# Patient Record
Sex: Male | Born: 1945 | Race: White | Hispanic: No | Marital: Married | Smoking: Current every day smoker
Health system: Southern US, Community
[De-identification: ages and names within clinical notes are randomized; demographics above are authoritative.]

## PROBLEM LIST (undated history)

## (undated) DIAGNOSIS — I2510 Atherosclerotic heart disease of native coronary artery without angina pectoris: Secondary | ICD-10-CM

## (undated) DIAGNOSIS — I10 Essential (primary) hypertension: Secondary | ICD-10-CM

## (undated) DIAGNOSIS — D369 Benign neoplasm, unspecified site: Secondary | ICD-10-CM

## (undated) DIAGNOSIS — Z22358 Carrier of other enterobacterales: Secondary | ICD-10-CM

## (undated) DIAGNOSIS — IMO0001 Reserved for inherently not codable concepts without codable children: Secondary | ICD-10-CM

## (undated) DIAGNOSIS — J449 Chronic obstructive pulmonary disease, unspecified: Secondary | ICD-10-CM

## (undated) DIAGNOSIS — Z860101 Personal history of adenomatous and serrated colon polyps: Secondary | ICD-10-CM

## (undated) DIAGNOSIS — N390 Urinary tract infection, site not specified: Secondary | ICD-10-CM

## (undated) DIAGNOSIS — E119 Type 2 diabetes mellitus without complications: Secondary | ICD-10-CM

## (undated) DIAGNOSIS — D126 Benign neoplasm of colon, unspecified: Secondary | ICD-10-CM

## (undated) DIAGNOSIS — M7120 Synovial cyst of popliteal space [Baker], unspecified knee: Secondary | ICD-10-CM

## (undated) DIAGNOSIS — E7800 Pure hypercholesterolemia, unspecified: Secondary | ICD-10-CM

## (undated) DIAGNOSIS — N138 Other obstructive and reflux uropathy: Secondary | ICD-10-CM

## (undated) DIAGNOSIS — R7302 Impaired glucose tolerance (oral): Secondary | ICD-10-CM

## (undated) DIAGNOSIS — Z85828 Personal history of other malignant neoplasm of skin: Secondary | ICD-10-CM

## (undated) HISTORY — PX: COLONOSCOPY: SHX174

## (undated) HISTORY — PX: CARDIAC CATHETERIZATION: SHX172

## (undated) HISTORY — PX: EYE SURGERY: SHX253

## (undated) HISTORY — DX: Atherosclerotic heart disease of native coronary artery without angina pectoris: I25.10

## (undated) HISTORY — DX: Essential (primary) hypertension: I10

---

## 2004-03-19 ENCOUNTER — Ambulatory Visit: Payer: Self-pay | Admitting: Unknown Physician Specialty

## 2009-05-29 ENCOUNTER — Ambulatory Visit: Payer: Self-pay | Admitting: Unknown Physician Specialty

## 2012-01-08 ENCOUNTER — Emergency Department: Payer: Self-pay | Admitting: Emergency Medicine

## 2012-01-08 LAB — BASIC METABOLIC PANEL
BUN: 12 mg/dL (ref 7–18)
Calcium, Total: 9.2 mg/dL (ref 8.5–10.1)
Chloride: 102 mmol/L (ref 98–107)
Creatinine: 0.93 mg/dL (ref 0.60–1.30)
EGFR (African American): >60
Potassium: 3.3 mmol/L — ABNORMAL LOW (ref 3.5–5.1)

## 2012-01-08 LAB — CBC
HCT: 38.5 % — ABNORMAL LOW (ref 40.0–52.0)
HGB: 13.8 g/dL (ref 13.0–18.0)
MCH: 33.3 pg (ref 26.0–34.0)
MCHC: 35.9 g/dL (ref 32.0–36.0)
MCV: 93 fL (ref 80–100)
RDW: 13.9 % (ref 11.5–14.5)

## 2013-08-17 DIAGNOSIS — R7303 Prediabetes: Secondary | ICD-10-CM | POA: Insufficient documentation

## 2014-06-15 DIAGNOSIS — R0681 Apnea, not elsewhere classified: Secondary | ICD-10-CM | POA: Insufficient documentation

## 2014-09-27 DIAGNOSIS — Z86010 Personal history of colonic polyps: Secondary | ICD-10-CM | POA: Insufficient documentation

## 2014-12-26 ENCOUNTER — Ambulatory Visit: Admission: RE | Admit: 2014-12-26 | Payer: Self-pay | Source: Ambulatory Visit | Admitting: Unknown Physician Specialty

## 2014-12-26 ENCOUNTER — Encounter: Admission: RE | Payer: Self-pay | Source: Ambulatory Visit

## 2014-12-26 SURGERY — COLONOSCOPY WITH PROPOFOL
Anesthesia: General

## 2014-12-30 ENCOUNTER — Encounter: Payer: Self-pay | Admitting: *Deleted

## 2015-01-01 ENCOUNTER — Encounter: Payer: Self-pay | Admitting: *Deleted

## 2015-01-01 DIAGNOSIS — Z86010 Personal history of colonic polyps: Secondary | ICD-10-CM | POA: Diagnosis present

## 2015-01-01 DIAGNOSIS — Z79899 Other long term (current) drug therapy: Secondary | ICD-10-CM | POA: Diagnosis not present

## 2015-01-01 DIAGNOSIS — Z800 Family history of malignant neoplasm of digestive organs: Secondary | ICD-10-CM | POA: Diagnosis not present

## 2015-01-01 DIAGNOSIS — K5730 Diverticulosis of large intestine without perforation or abscess without bleeding: Secondary | ICD-10-CM | POA: Diagnosis not present

## 2015-01-01 DIAGNOSIS — R109 Unspecified abdominal pain: Secondary | ICD-10-CM | POA: Diagnosis not present

## 2015-01-01 DIAGNOSIS — N400 Enlarged prostate without lower urinary tract symptoms: Secondary | ICD-10-CM | POA: Diagnosis not present

## 2015-01-01 DIAGNOSIS — Z7982 Long term (current) use of aspirin: Secondary | ICD-10-CM | POA: Diagnosis not present

## 2015-01-01 DIAGNOSIS — E7800 Pure hypercholesterolemia, unspecified: Secondary | ICD-10-CM | POA: Diagnosis not present

## 2015-01-01 DIAGNOSIS — I2510 Atherosclerotic heart disease of native coronary artery without angina pectoris: Secondary | ICD-10-CM | POA: Diagnosis not present

## 2015-01-01 DIAGNOSIS — K640 First degree hemorrhoids: Secondary | ICD-10-CM | POA: Diagnosis not present

## 2015-01-01 DIAGNOSIS — I119 Hypertensive heart disease without heart failure: Secondary | ICD-10-CM | POA: Diagnosis not present

## 2015-01-01 DIAGNOSIS — Z09 Encounter for follow-up examination after completed treatment for conditions other than malignant neoplasm: Secondary | ICD-10-CM | POA: Diagnosis not present

## 2015-01-01 NOTE — ED Notes (Addendum)
Pt states he has been unable to urinate since about 5 or 6 pm today. Pt reports he is currently undergoing a colonoscopy bowel prep

## 2015-01-02 ENCOUNTER — Ambulatory Visit
Admission: RE | Admit: 2015-01-02 | Discharge: 2015-01-02 | Disposition: A | Discharge status: Home / Self Care | Payer: Medicare Other | Source: Ambulatory Visit | Attending: Unknown Physician Specialty | Admitting: Unknown Physician Specialty

## 2015-01-02 ENCOUNTER — Ambulatory Visit: Payer: Medicare Other | Admitting: *Deleted

## 2015-01-02 ENCOUNTER — Emergency Department: Payer: Medicare Other

## 2015-01-02 ENCOUNTER — Encounter
Admission: RE | Disposition: A | Discharge status: Home / Self Care | Payer: Self-pay | Source: Ambulatory Visit | Attending: Unknown Physician Specialty

## 2015-01-02 ENCOUNTER — Encounter: Payer: Self-pay | Admitting: *Deleted

## 2015-01-02 ENCOUNTER — Emergency Department
Admission: EM | Admit: 2015-01-02 | Discharge: 2015-01-02 | Disposition: A | Discharge status: Home / Self Care | Payer: Medicare Other | Attending: Unknown Physician Specialty | Admitting: Unknown Physician Specialty

## 2015-01-02 DIAGNOSIS — N401 Enlarged prostate with lower urinary tract symptoms: Secondary | ICD-10-CM

## 2015-01-02 DIAGNOSIS — Z800 Family history of malignant neoplasm of digestive organs: Secondary | ICD-10-CM | POA: Insufficient documentation

## 2015-01-02 DIAGNOSIS — Z86010 Personal history of colonic polyps: Secondary | ICD-10-CM | POA: Insufficient documentation

## 2015-01-02 DIAGNOSIS — Z7982 Long term (current) use of aspirin: Secondary | ICD-10-CM | POA: Insufficient documentation

## 2015-01-02 DIAGNOSIS — R338 Other retention of urine: Secondary | ICD-10-CM

## 2015-01-02 DIAGNOSIS — K5730 Diverticulosis of large intestine without perforation or abscess without bleeding: Secondary | ICD-10-CM | POA: Insufficient documentation

## 2015-01-02 DIAGNOSIS — Z09 Encounter for follow-up examination after completed treatment for conditions other than malignant neoplasm: Secondary | ICD-10-CM | POA: Insufficient documentation

## 2015-01-02 DIAGNOSIS — I2510 Atherosclerotic heart disease of native coronary artery without angina pectoris: Secondary | ICD-10-CM | POA: Insufficient documentation

## 2015-01-02 DIAGNOSIS — I119 Hypertensive heart disease without heart failure: Secondary | ICD-10-CM | POA: Insufficient documentation

## 2015-01-02 DIAGNOSIS — Z79899 Other long term (current) drug therapy: Secondary | ICD-10-CM | POA: Insufficient documentation

## 2015-01-02 DIAGNOSIS — N400 Enlarged prostate without lower urinary tract symptoms: Secondary | ICD-10-CM | POA: Insufficient documentation

## 2015-01-02 DIAGNOSIS — K640 First degree hemorrhoids: Secondary | ICD-10-CM | POA: Insufficient documentation

## 2015-01-02 DIAGNOSIS — R109 Unspecified abdominal pain: Secondary | ICD-10-CM | POA: Insufficient documentation

## 2015-01-02 DIAGNOSIS — E7800 Pure hypercholesterolemia, unspecified: Secondary | ICD-10-CM | POA: Insufficient documentation

## 2015-01-02 HISTORY — DX: Atherosclerotic heart disease of native coronary artery without angina pectoris: I25.10

## 2015-01-02 HISTORY — DX: Essential (primary) hypertension: I10

## 2015-01-02 HISTORY — DX: Benign neoplasm, unspecified site: D36.9

## 2015-01-02 HISTORY — DX: Impaired glucose tolerance (oral): R73.02

## 2015-01-02 HISTORY — DX: Pure hypercholesterolemia, unspecified: E78.00

## 2015-01-02 HISTORY — DX: Reserved for inherently not codable concepts without codable children: IMO0001

## 2015-01-02 HISTORY — PX: COLONOSCOPY WITH PROPOFOL: SHX5780

## 2015-01-02 LAB — URINALYSIS COMPLETE WITH MICROSCOPIC (ARMC ONLY)
Bilirubin Urine: NEGATIVE
GLUCOSE, UA: NEGATIVE mg/dL
Ketones, ur: NEGATIVE mg/dL
Leukocytes, UA: NEGATIVE
Nitrite: NEGATIVE
PROTEIN: NEGATIVE mg/dL
SPECIFIC GRAVITY, URINE: 1.004 — AB (ref 1.005–1.030)
SQUAMOUS EPITHELIAL / LPF: NONE SEEN
pH: 5 (ref 5.0–8.0)

## 2015-01-02 LAB — BASIC METABOLIC PANEL
Anion gap: 10 (ref 5–15)
BUN: 13 mg/dL (ref 6–20)
CALCIUM: 9.4 mg/dL (ref 8.9–10.3)
CHLORIDE: 104 mmol/L (ref 101–111)
CO2: 22 mmol/L (ref 22–32)
CREATININE: 0.76 mg/dL (ref 0.61–1.24)
GFR calc non Af Amer: >60 mL/min (ref >60)
GLUCOSE: 144 mg/dL — AB (ref 65–99)
Potassium: 4.1 mmol/L (ref 3.5–5.1)
Sodium: 136 mmol/L (ref 135–145)

## 2015-01-02 LAB — CBC WITH DIFFERENTIAL/PLATELET
BASOS PCT: 0 %
Basophils Absolute: 0 10*3/uL (ref 0–0.1)
Eosinophils Absolute: 0 10*3/uL (ref 0–0.7)
Eosinophils Relative: 0 %
HEMATOCRIT: 47 % (ref 40.0–52.0)
HEMOGLOBIN: 16.3 g/dL (ref 13.0–18.0)
LYMPHS ABS: 0.6 10*3/uL — AB (ref 1.0–3.6)
Lymphocytes Relative: 3 %
MCH: 31.5 pg (ref 26.0–34.0)
MCHC: 34.8 g/dL (ref 32.0–36.0)
MCV: 90.5 fL (ref 80.0–100.0)
MONO ABS: 0.8 10*3/uL (ref 0.2–1.0)
MONOS PCT: 5 %
NEUTROS ABS: 16.6 10*3/uL — AB (ref 1.4–6.5)
Neutrophils Relative %: 92 %
Platelets: 153 10*3/uL (ref 150–440)
RBC: 5.19 MIL/uL (ref 4.40–5.90)
RDW: 13 % (ref 11.5–14.5)
WBC: 18.1 10*3/uL — ABNORMAL HIGH (ref 3.8–10.6)

## 2015-01-02 SURGERY — COLONOSCOPY WITH PROPOFOL
Anesthesia: General

## 2015-01-02 MED ORDER — SODIUM CHLORIDE 0.9 % IV BOLUS (SEPSIS)
500.0000 mL | Freq: Once | INTRAVENOUS | Status: AC
  Administered 2015-01-02: 500 mL via INTRAVENOUS

## 2015-01-02 MED ORDER — PROPOFOL 500 MG/50ML IV EMUL
INTRAVENOUS | Status: DC | PRN
  Administered 2015-01-02: 120 ug/kg/min via INTRAVENOUS

## 2015-01-02 MED ORDER — TAMSULOSIN HCL 0.4 MG PO CAPS: 0.4000 mg | ORAL_CAPSULE | Freq: Every day | ORAL | Status: DC

## 2015-01-02 MED ORDER — SODIUM CHLORIDE 0.9 % IV SOLN
INTRAVENOUS | Status: DC
Start: 2015-01-02 — End: 2015-01-02
  Administered 2015-01-02: 15:00:00 via INTRAVENOUS

## 2015-01-02 MED ORDER — CIPROFLOXACIN HCL 500 MG PO TABS
500.0000 mg | ORAL_TABLET | Freq: Once | ORAL | Status: AC
  Administered 2015-01-02: 500 mg via ORAL

## 2015-01-02 MED ORDER — CIPROFLOXACIN HCL 500 MG PO TABS
ORAL_TABLET | ORAL | Status: AC
  Filled 2015-01-02: qty 1

## 2015-01-02 MED ORDER — CIPROFLOXACIN HCL 500 MG PO TABS: 500.0000 mg | ORAL_TABLET | Freq: Two times a day (BID) | ORAL | Status: DC

## 2015-01-02 MED ORDER — SODIUM CHLORIDE 0.9 % IV SOLN: INTRAVENOUS | Status: DC

## 2015-01-02 NOTE — Op Note (Signed)
[facility] Gastroenterology Patient Name: Brandon Brandt Procedure Date: 01/02/2015 3:33 PM MRN: [id number] Account #: [id number] Date of Birth: February 10, 1946 Admit Type: Outpatient Age: 69 Room: [facility] ENDO ROOM 1 Gender: Male Note Status: Finalized Procedure:         Colonoscopy Indications:       High risk colon cancer surveillance: Personal history of                     colonic polyps, Family history of colon cancer in a                     first-degree relative Providers:         Manya Silvas, MD Referring MD:      No Local Md, MD (Referring MD) Medicines:         Propofol per Anesthesia Complications:     No immediate complications. Procedure:         Pre-Anesthesia Assessment:                    - After reviewing the risks and benefits, the patient was                     deemed in satisfactory condition to undergo the procedure.                    After obtaining informed consent, the colonoscope was                     passed under direct vision. Throughout the procedure, the                     patient's blood pressure, pulse, and oxygen saturations                     were monitored continuously. The Colonoscope was                     introduced through the anus and advanced to the the cecum,                     identified by appendiceal orifice and ileocecal valve. The                     colonoscopy was performed without difficulty. The patient                     tolerated the procedure well. The quality of the bowel                     preparation was adequate to identify polyps. Findings:      Internal hemorrhoids were found during endoscopy. The hemorrhoids were       small, medium-sized and Grade I (internal hemorrhoids that do not       prolapse). Prostate was enlarged and boggy.      A single medium-mouthed diverticulum was found in the sigmoid colon. Impression:        - Internal hemorrhoids.                    - No specimens  collected. Recommendation:    - Repeat colonoscopy in 5 years due to previous polyp and  family history of colon polyp. Manya Silvas, MD 01/02/2015 3:57:45 PM This report has been signed electronically. Number of Addenda: 0 Note Initiated On: 01/02/2015 3:33 PM Scope Withdrawal Time: 0 hours 3 minutes 37 seconds  Total Procedure Duration: 0 hours 11 minutes 40 seconds       [facility]

## 2015-01-02 NOTE — Transfer of Care (Signed)
Immediate Anesthesia Transfer of Care Note  Patient: Brandon Brandt  Procedure(s) Performed: Procedure(s): COLONOSCOPY WITH PROPOFOL (N/A)  Patient Location: PACU and Endoscopy Unit  Anesthesia Type:General  Level of Consciousness: awake, alert  and oriented  Airway & Oxygen Therapy: Patient Spontanous Breathing  Post-op Assessment: Report given to RN and Post -op Vital signs reviewed and stable  Post vital signs: Reviewed and stable  Last Vitals:  Filed Vitals:   01/02/15 1558  BP:   Pulse: 77  Temp: 36.3 C  Resp: 18    Complications: No apparent anesthesia complications

## 2015-01-02 NOTE — ED Notes (Signed)
Pt states pain improved, foley continues to drain clear yellow urine without sediment at this time.

## 2015-01-02 NOTE — Anesthesia Preprocedure Evaluation (Signed)
Anesthesia Evaluation  Patient identified by MRN, date of birth, ID band Patient awake    Reviewed: Allergy & Precautions, NPO status , Patient's Chart, lab work & pertinent test results  History of Anesthesia Complications Negative for: history of anesthetic complications  Airway Mallampati: III       Dental   Pulmonary shortness of breath, COPD,  COPD inhaler,           Cardiovascular hypertension, Pt. on medications + CAD  negative cardio ROS       Neuro/Psych negative neurological ROS     GI/Hepatic negative GI ROS, Neg liver ROS,   Endo/Other  negative endocrine ROS  Renal/GU negative Renal ROS     Musculoskeletal   Abdominal   Peds  Hematology negative hematology ROS (+)   Anesthesia Other Findings   Reproductive/Obstetrics                             Anesthesia Physical Anesthesia Plan  ASA: III  Anesthesia Plan: General   Post-op Pain Management:    Induction: Intravenous  Airway Management Planned: Nasal Cannula  Additional Equipment:   Intra-op Plan:   Post-operative Plan:   Informed Consent: I have reviewed the patients History and Physical, chart, labs and discussed the procedure including the risks, benefits and alternatives for the proposed anesthesia with the patient or authorized representative who has indicated his/her understanding and acceptance.     Plan Discussed with:   Anesthesia Plan Comments:         Anesthesia Quick Evaluation

## 2015-01-02 NOTE — ED Provider Notes (Signed)
[facility] Emergency Department Provider Note  ____________________________________________  Time seen: Approximately 12:25 AM  I have reviewed the triage vital signs and the nursing notes.   HISTORY  Chief Complaint Urinary Retention    HPI Brandon Brandt is a 69 y.o. male who presents to the ED from home with a chief complaint of urinary retention. Patient has a past medical history significant for BPH. He did a colonoscopy prep yesterday; states he drank 2 gallons of fluids. Has been unable to urinate since approximately 5 or 6 PM. Denies fever, chills, chest pain, shortness of breath, nausea, vomiting. Has had multiple bowel movements since starting colonoscopy prep. Denies flank pain or hematuria. Denies prior history of kidney stones. Nothing makes his pain better or worse.   Past Medical History  Diagnosis Date  . Hypertension   . Coronary artery disease   . Pure hypercholesterolemia   . Impaired glucose tolerance   . Shortness of breath dyspnea   . Adenomatous polyps     There are no active problems to display for this patient.   Past Surgical History  Procedure Laterality Date  . Colonoscopy    . Cardiac catheterization      Current Outpatient Rx  Name  Route  Sig  Dispense  Refill  . albuterol (PROVENTIL HFA;VENTOLIN HFA) 108 (90 BASE) MCG/ACT inhaler   Inhalation   Inhale into the lungs every 6 (six) hours as needed for wheezing or shortness of breath.         Marland Kitchen ascorbic acid (VITAMIN C) 500 MG tablet   Oral   Take 500 mg by mouth daily.         Marland Kitchen aspirin 81 MG tablet   Oral   Take 81 mg by mouth daily.         Marland Kitchen lisinopril-hydrochlorothiazide (PRINZIDE,ZESTORETIC) 20-12.5 MG tablet   Oral   Take 1 tablet by mouth daily.         . Multiple Vitamin (MULTIVITAMIN) tablet   Oral   Take 1 tablet by mouth daily.         . simvastatin (ZOCOR) 80 MG tablet   Oral   Take 80 mg by mouth daily.         . SM  OMEGA-3-6-9 FATTY ACIDS PO   Oral   Take by mouth.         . varenicline (CHANTIX) 0.5 MG tablet   Oral   Take 0.5 mg by mouth 2 (two) times daily.         . vitamin E (VITAMIN E) 400 UNIT capsule   Oral   Take 400 Units by mouth daily.           Allergies Review of patient's allergies indicates no known allergies.  No family history on file.  Social History Social History  Substance Use Topics  . Smoking status: Never Smoker   . Smokeless tobacco: None  . Alcohol Use: No    Review of Systems Constitutional: No fever/chills Eyes: No visual changes. ENT: No sore throat. Cardiovascular: Denies chest pain. Respiratory: Denies shortness of breath. Gastrointestinal: Positive for abdominal pain.  No nausea, no vomiting.  No diarrhea.  No constipation. Genitourinary: Positive for urinary retention.Negative for dysuria. Musculoskeletal: Negative for back pain. Skin: Negative for rash. Neurological: Negative for headaches, focal weakness or numbness.  10-point ROS otherwise negative.  ____________________________________________   PHYSICAL EXAM:  VITAL SIGNS: ED Triage Vitals  Enc Vitals Group     BP --  Pulse Rate 01/01/15 2348 78     Resp --      Temp 01/01/15 2348 97.8 F (36.6 C)     Temp Source 01/01/15 2348 Oral     SpO2 --      Weight 01/01/15 2348 240 lb (108.863 kg)     Height 01/01/15 2348 5\' 9"  (1.753 m)     Head Cir --      Peak Flow --      Pain Score 01/01/15 2347 7     Pain Loc --      Pain Edu? --      Excl. in [place]? --    Patient was examined after Foley placement: Constitutional: Alert and oriented. Well appearing and in no acute distress. Eyes: Conjunctivae are normal. PERRL. EOMI. Head: Atraumatic. Nose: No congestion/rhinnorhea. Mouth/Throat: Mucous membranes are moist.  Oropharynx non-erythematous. Neck: No stridor.   Cardiovascular: Normal rate, regular rhythm. Grossly normal heart sounds.  Good peripheral  circulation. Respiratory: Normal respiratory effort.  No retractions. Lungs CTAB. Gastrointestinal: Soft and nontender. No distention. No abdominal bruits. No CVA tenderness. Genitourinary: Circumcised male. No testicular swelling. No inguinal masses. Strong bilateral cremaster reflexes. Musculoskeletal: No lower extremity tenderness nor edema.  No joint effusions. Neurologic:  Normal speech and language. No gross focal neurologic deficits are appreciated. No gait instability. Skin:  Skin is warm, dry and intact. No rash noted. Psychiatric: Mood and affect are normal. Speech and behavior are normal.  ____________________________________________   LABS (all labs ordered are listed, but only abnormal results are displayed)  Labs Reviewed  CBC WITH DIFFERENTIAL/PLATELET - Abnormal; Notable for the following:    WBC 18.1 (*)    Neutro Abs 16.6 (*)    Lymphs Abs 0.6 (*)    All other components within normal limits  BASIC METABOLIC PANEL - Abnormal; Notable for the following:    Glucose, Bld 144 (*)    All other components within normal limits  URINALYSIS COMPLETEWITH MICROSCOPIC ([facility] ONLY) - Abnormal; Notable for the following:    Color, Urine YELLOW (*)    APPearance CLEAR (*)    Specific Gravity, Urine 1.004 (*)    Hgb urine dipstick 1+ (*)    Bacteria, UA RARE (*)    All other components within normal limits   ____________________________________________  EKG  None ____________________________________________  RADIOLOGY  CT renal stone study interpreted per Dr. Radene Knee: 1. Significantly enlarged prostate, measuring 6.4 cm in transverse dimension, and 7.6 cm in craniocaudal dimension. This may help explain the patient's difficulty urinating. Scattered calcification noted. 2. Foley catheter in place, with decompressed bladder. 3. Scattered calcification along the abdominal aorta and its branches. 4. Scattered diverticulosis along the proximal sigmoid colon, without  evidence of diverticulitis.  ____________________________________________   PROCEDURES  Procedure(s) performed: None  Critical Care performed: No  ____________________________________________   INITIAL IMPRESSION / ASSESSMENT AND PLAN / ED COURSE  Pertinent labs & imaging results that were available during my care of the patient were reviewed by me and considered in my medical decision making (see chart for details).  69 year old male who presents with urinary retention. 879 mL seen on bladder scan. Foley placed by nurse; there are multiple small pieces of sediment in the Foley bag suspicious for kidney stones. Will obtain a renal protocol CT to evaluate. Will check screening labs including renal function. At this time patient is quite comfortable, smiling in no distress.  ----------------------------------------- 2:37 AM on 01/02/2015 -----------------------------------------  Patient resting in no acute distress. Updated  patient and spouse of laboratory and imaging results. Will initiate treatment with Flomax for BPH; patient to start this after his colonoscopy today. Will keep Foley catheter in place; start Cipro empirically, and patient will follow-up with urology. Strict return precautions given. Patient and spouse verbalize understanding and agree with plan of care. ____________________________________________   FINAL CLINICAL IMPRESSION(S) / ED DIAGNOSES  Final diagnoses:  Urinary retention due to benign prostatic hyperplasia      Paulette Blanch, MD 01/02/15 [phone]

## 2015-01-02 NOTE — H&P (Signed)
Primary Care Physician:  [place] Primary Gastroenterologist:  Dr. Vira Agar  Pre-Procedure History & Physical: HPI:  Brandon Brandt is a 69 y.o. male is here for an colonoscopy.   Past Medical History  Diagnosis Date  . Hypertension   . Coronary artery disease   . Pure hypercholesterolemia   . Impaired glucose tolerance   . Shortness of breath dyspnea   . Adenomatous polyps     Past Surgical History  Procedure Laterality Date  . Colonoscopy    . Cardiac catheterization      Prior to Admission medications   Medication Sig Start Date End Date Taking? Authorizing Provider  albuterol (PROVENTIL HFA;VENTOLIN HFA) 108 (90 BASE) MCG/ACT inhaler Inhale into the lungs every 6 (six) hours as needed for wheezing or shortness of breath.   Yes Historical Provider, MD  ascorbic acid (VITAMIN C) 500 MG tablet Take 500 mg by mouth daily.   Yes Historical Provider, MD  aspirin 81 MG tablet Take 81 mg by mouth daily.   Yes Historical Provider, MD  ciprofloxacin (CIPRO) 500 MG tablet Take 1 tablet (500 mg total) by mouth 2 (two) times daily. 01/02/15  Yes Paulette Blanch, MD  lisinopril-hydrochlorothiazide (PRINZIDE,ZESTORETIC) 20-12.5 MG tablet Take 1 tablet by mouth daily.   Yes Historical Provider, MD  Multiple Vitamin (MULTIVITAMIN) tablet Take 1 tablet by mouth daily.   Yes Historical Provider, MD  vitamin E (VITAMIN E) 400 UNIT capsule Take 400 Units by mouth daily.   Yes Historical Provider, MD  simvastatin (ZOCOR) 80 MG tablet Take 80 mg by mouth daily.    Historical Provider, MD  SM OMEGA-3-6-9 FATTY ACIDS PO Take by mouth.    Historical Provider, MD  tamsulosin (FLOMAX) 0.4 MG CAPS capsule Take 1 capsule (0.4 mg total) by mouth daily. Patient not taking: Reported on 01/02/2015 01/02/15   Paulette Blanch, MD  varenicline (CHANTIX) 0.5 MG tablet Take 0.5 mg by mouth 2 (two) times daily.    Historical Provider, MD    Allergies as of 12/08/2014  . (Not on File)    History  reviewed. No pertinent family history.  Social History   Social History  . Marital Status: Married    Spouse Name: N/A  . Number of Children: N/A  . Years of Education: N/A   Occupational History  . Not on file.   Social History Main Topics  . Smoking status: Never Smoker   . Smokeless tobacco: Not on file  . Alcohol Use: No  . Drug Use: No  . Sexual Activity: Not on file   Other Topics Concern  . Not on file   Social History Narrative    Review of Systems: See HPI, otherwise negative ROS  Physical Exam: BP 149/69 mmHg  Pulse 73  Temp(Src) 98.4 F (36.9 C) (Oral)  Resp 20  Ht 5\' 11"  (1.803 m)  Wt 108.863 kg (240 lb)  BMI 33.49 kg/m2  SpO2 98% General:   Alert,  pleasant and cooperative in NAD Head:  Normocephalic and atraumatic. Neck:  Supple; no masses or thyromegaly. Lungs:  Clear throughout to auscultation.    Heart:  Regular rate and rhythm. Abdomen:  Soft, nontender and nondistended. Normal bowel sounds, without guarding, and without rebound.   Neurologic:  Alert and  oriented x4;  grossly normal neurologically.  Impression/Plan: Brandon Brandt is here for an colonoscopy to be performed for [facility] colon polyps and [facility] in father  Risks, benefits, limitations, and alternatives regarding  colonoscopy have been reviewed with the patient.  Questions have been answered.  All parties agreeable.   Gaylyn Cheers, MD  01/02/2015, 3:34 PM

## 2015-01-02 NOTE — Discharge Instructions (Signed)
1. Take antibiotic as prescribed (Cipro 500 mg twice daily 7 days). 2. Start Flomax 0.4 mg daily; start on Tuesday after your colonoscopy. 3. Keep Foley catheter in place until seen by the urologist in 7-10 days. 4. Return to the ER for worsening symptoms, persistent vomiting, fever or other concerns.  Acute Urinary Retention, Male Acute urinary retention is the temporary inability to urinate. This is a common problem in older men. As men age their prostates become larger and block the flow of urine from the bladder. This is usually a problem that has come on gradually.  HOME CARE INSTRUCTIONS If you are sent home with a Foley catheter and a drainage system, you will need to discuss the best course of action with your health care provider. While the catheter is in, maintain a good intake of fluids. Keep the drainage bag emptied and lower than your catheter. This is so that contaminated urine will not flow back into your bladder, which could lead to a urinary tract infection. There are two main types of drainage bags. One is a large bag that usually is used at night. It has a good capacity that will allow you to sleep through the night without having to empty it. The second type is called a leg bag. It has a smaller capacity, so it needs to be emptied more frequently. However, the main advantage is that it can be attached by a leg strap and can go underneath your clothing, allowing you the freedom to move about or leave your home. Only take over-the-counter or prescription medicines for pain, discomfort, or fever as directed by your health care provider.  SEEK MEDICAL CARE IF:  You develop a low-grade fever.  You experience spasms or leakage of urine with the spasms. SEEK IMMEDIATE MEDICAL CARE IF:   You develop chills or fever.  Your catheter stops draining urine.  Your catheter falls out.  You start to develop increased bleeding that does not respond to rest and increased fluid  intake. MAKE SURE YOU:  Understand these instructions.  Will watch your condition.  Will get help right away if you are not doing well or get worse.   This information is not intended to replace advice given to you by your health care provider. Make sure you discuss any questions you have with your health care provider.   Document Released: 06/03/2000 Document Revised: 07/12/2014 Document Reviewed: 08/06/2012 Elsevier Interactive Patient Education [employer].

## 2015-01-02 NOTE — ED Notes (Signed)
Total of 1460 mL of yellow urine drained from foley bag.

## 2015-01-02 NOTE — Anesthesia Postprocedure Evaluation (Signed)
  Anesthesia Post-op Note  Patient: Brandon Brandt  Procedure(s) Performed: Procedure(s): COLONOSCOPY WITH PROPOFOL (N/A)  Anesthesia type:General  Patient location: PACU  Post pain: Pain level controlled  Post assessment: Post-op Vital signs reviewed, Patient's Cardiovascular Status Stable, Respiratory Function Stable, Patent Airway and No signs of Nausea or vomiting  Post vital signs: Reviewed and stable  Last Vitals:  Filed Vitals:   01/02/15 1630  BP: 110/65  Pulse: 68  Temp:   Resp: 20    Level of consciousness: awake, alert  and patient cooperative  Complications: No apparent anesthesia complications

## 2015-01-02 NOTE — ED Notes (Signed)
Pt has 829ml on bladder scan

## 2015-01-02 NOTE — ED Notes (Signed)
Pt denies pain currently. Pt updated on result progress. Pt verbalizes understanding.

## 2015-01-02 NOTE — ED Notes (Signed)
Pt fitted with leg bag. Pt and spouse return demonstration regarding leg bag care, catheter care, signs and symptoms of UTI/pyelonephritis, and catheter maintanence. Clear yellow urine returned in leg bag after fitting.

## 2015-01-03 ENCOUNTER — Ambulatory Visit: Payer: Self-pay | Admitting: Obstetrics and Gynecology

## 2015-01-05 ENCOUNTER — Encounter: Payer: Self-pay | Admitting: Unknown Physician Specialty

## 2015-01-09 ENCOUNTER — Other Ambulatory Visit: Payer: Self-pay | Admitting: *Deleted

## 2015-01-09 ENCOUNTER — Ambulatory Visit (INDEPENDENT_AMBULATORY_CARE_PROVIDER_SITE_OTHER): Payer: Medicare Other | Admitting: Obstetrics and Gynecology

## 2015-01-09 ENCOUNTER — Encounter: Payer: Self-pay | Admitting: Obstetrics and Gynecology

## 2015-01-09 ENCOUNTER — Ambulatory Visit: Payer: Medicare Other

## 2015-01-09 VITALS — BP 145/80 | HR 66 | Resp 16 | Ht 69.5 in | Wt 226.4 lb

## 2015-01-09 DIAGNOSIS — R339 Retention of urine, unspecified: Secondary | ICD-10-CM | POA: Diagnosis not present

## 2015-01-09 DIAGNOSIS — N400 Enlarged prostate without lower urinary tract symptoms: Secondary | ICD-10-CM | POA: Diagnosis not present

## 2015-01-09 LAB — BLADDER SCAN AMB NON-IMAGING: SCAN RESULT: 150

## 2015-01-09 MED ORDER — TAMSULOSIN HCL 0.4 MG PO CAPS: 0.4000 mg | ORAL_CAPSULE | Freq: Every day | ORAL | Status: DC

## 2015-01-09 NOTE — Addendum Note (Signed)
Addended by: Toniann Fail C on: 01/09/2015 04:16 PM   Modules accepted: Orders

## 2015-01-09 NOTE — Progress Notes (Signed)
Bladder Scan-150 Patient can void: Performed By: Toniann Fail, LPN  Pt was instructed to go to the ER if develops pain or not able to urinate over night or come to the office during hours. Pt voiced understanding.

## 2015-01-09 NOTE — Progress Notes (Signed)
01/09/2015 11:48 AM   Brandon Brandt [birth year] [id number]  Referring provider: Katheren Brandt [address] [place], [place] [address]  Chief Complaint  Patient presents with  . Establish Care  . Benign Prostatic Hypertrophy  . Urinary Retention    HPI: Patient is 69 yo male with a history of BPH presenting today with c/o of acute urinary retention after drinking 2 gallons of water for colonoscopy bowel prep 01/02/15.  He was seen in the  ED for catheter placement and was started on Flomax and Cipro for presumed prostatitis? He states that he underwent his colonoscopy and was told that his prostate was boggy.  He presents today requesting Foley catheter removal. Patient denies any fevers or pain since catheter placement.  UA unremarkable in ED but patient did have an elevated WBC of 18.1. CT renal stone protocol noting aSignificantly enlarged prostate, measuring 6.4 cm in transverse dimension, and 7.6 cm in craniocaudal dimension.   Baseline urinary symptoms include sensation of incomplete bladder emptying, frequency, intermittency, urgency, weak stream, and nocturia up to 3 times per night.    I-PSS 15 QOL- terrible   PMH: Past Medical History  Diagnosis Date  . Hypertension   . Coronary artery disease   . Pure hypercholesterolemia   . Impaired glucose tolerance   . Shortness of breath dyspnea   . Adenomatous polyps     Surgical History: Past Surgical History  Procedure Laterality Date  . Colonoscopy    . Cardiac catheterization    . Colonoscopy with propofol N/A 01/02/2015    Procedure: COLONOSCOPY WITH PROPOFOL;  Surgeon: Brandon Silvas, Brandon Brandt;  Location: [facility] ENDOSCOPY;  Service: Endoscopy;  Laterality: N/A;    Home Medications:    Medication List       This list is accurate as of: 01/09/15 11:48 AM.  Always use your most recent med list.               albuterol 108 (90 BASE) MCG/ACT inhaler  Commonly known as:  PROVENTIL HFA;VENTOLIN HFA   Inhale into the lungs every 6 (six) hours as needed for wheezing or shortness of breath.     ascorbic acid 500 MG tablet  Commonly known as:  VITAMIN C  Take 500 mg by mouth daily.     aspirin 81 MG tablet  Take 81 mg by mouth daily.     ciprofloxacin 500 MG tablet  Commonly known as:  CIPRO  Take 1 tablet (500 mg total) by mouth 2 (two) times daily.     lisinopril-hydrochlorothiazide 20-12.5 MG tablet  Commonly known as:  PRINZIDE,ZESTORETIC  Take 1 tablet by mouth daily.     multivitamin tablet  Take 1 tablet by mouth daily.     simvastatin 80 MG tablet  Commonly known as:  ZOCOR  Take 80 mg by mouth daily.     SM OMEGA-3-6-9 FATTY ACIDS PO  Take by mouth.     tamsulosin 0.4 MG Caps capsule  Commonly known as:  FLOMAX  Take 1 capsule (0.4 mg total) by mouth daily.     vitamin E 400 UNIT capsule  Generic drug:  vitamin E  Take 400 Units by mouth daily.        Allergies: No Known Allergies  Family History: Family History  Problem Relation Age of Onset  . Prostate cancer Father     Social History:  reports that he has quit smoking. His smoking use included Cigarettes. He does not have any smokeless tobacco history  on file. He reports that he does not drink alcohol or use illicit drugs.  ROS: UROLOGY Frequent Urination?: Yes Hard to postpone urination?: No Burning/pain with urination?: No Get up at night to urinate?: Yes Leakage of urine?: No Urine stream starts and stops?: No Trouble starting stream?: Yes Do you have to strain to urinate?: No Blood in urine?: No Urinary tract infection?: No Sexually transmitted disease?: No Injury to kidneys or bladder?: No Painful intercourse?: No Weak stream?: No Erection problems?: No Penile pain?: No  Gastrointestinal Nausea?: No Vomiting?: No Indigestion/heartburn?: No Diarrhea?: No Constipation?: No  Constitutional Fever: No Night sweats?: No Weight loss?: No Fatigue?: No  Skin Skin  rash/lesions?: No Itching?: No  Eyes Blurred vision?: No Double vision?: No  Ears/Nose/Throat Sore throat?: No Sinus problems?: No  Hematologic/Lymphatic Swollen glands?: No Easy bruising?: No  Cardiovascular Leg swelling?: No Chest pain?: No  Respiratory Cough?: No Shortness of breath?: Yes  Endocrine Excessive thirst?: No  Musculoskeletal Back pain?: No Joint pain?: No  Neurological Headaches?: No Dizziness?: No  Psychologic Depression?: No Anxiety?: No  Physical Exam: BP 145/80 mmHg  Pulse 66  Resp 16  Ht 5' 9.5" (1.765 m)  Wt 226 lb 6.4 oz (102.694 kg)  BMI 32.97 kg/m2  Constitutional:  Alert and oriented, No acute distress. HEENT: [place] AT, moist mucus membranes.  Trachea midline, no masses. Cardiovascular: No clubbing, cyanosis, or edema. Respiratory: Normal respiratory effort, no increased work of breathing. GI: Abdomen is soft, nontender, nondistended, no abdominal masses GU: No CVA tenderness.  Testicles descended bilaterally without palpable masses, normal circumcised phallus with Foley catheter present in urinary meatus DRE: deferred until next visit Skin: No rashes, bruises or suspicious lesions. Lymph: No cervical or inguinal adenopathy. Neurologic: Grossly intact, no focal deficits, moving all 4 extremities. Psychiatric: Normal mood and affect.  Laboratory Data:   Urinalysis    Component Value Date/Time   COLORURINE YELLOW* 01/02/2015 0024   APPEARANCEUR CLEAR* 01/02/2015 0024   LABSPEC 1.004* 01/02/2015 0024   PHURINE 5.0 01/02/2015 0024   GLUCOSEU NEGATIVE 01/02/2015 0024   HGBUR 1+* 01/02/2015 0024   BILIRUBINUR NEGATIVE 01/02/2015 0024   KETONESUR NEGATIVE 01/02/2015 0024   PROTEINUR NEGATIVE 01/02/2015 0024   NITRITE NEGATIVE 01/02/2015 0024   LEUKOCYTESUR NEGATIVE 01/02/2015 0024    Pertinent Imaging: EXAM: CT ABDOMEN AND PELVIS WITHOUT CONTRAST  TECHNIQUE: Multidetector CT imaging of the abdomen and pelvis was  performed following the standard protocol without IV contrast.  COMPARISON: None.  FINDINGS: Minimal right basilar scarring is noted.  The liver and spleen are unremarkable in appearance. The gallbladder is within normal limits. The pancreas and adrenal glands are unremarkable.  The kidneys are unremarkable in appearance. There is no evidence of hydronephrosis. No renal or ureteral stones are seen. Minimal nonspecific perinephric stranding is noted bilaterally.  No free fluid is identified. The small bowel is unremarkable in appearance. The stomach is within normal limits. No acute vascular abnormalities are seen. Scattered calcification is noted along the abdominal aorta and its branches.  The appendix is normal in caliber, without evidence of appendicitis. Scattered diverticulosis is noted about the proximal sigmoid colon, and minimally along the remainder of the colon, without evidence of diverticulitis.  The bladder is decompressed, with a Foley catheter in place. The prostate is enlarged, measuring 6.4 cm in transverse dimension, and 7.6 cm in craniocaudal dimension, with mild scattered surrounding calcification. No inguinal lymphadenopathy is seen.  No acute osseous abnormalities are identified. Vacuum phenomenon is noted at  L5-S1, with underlying facet disease.  IMPRESSION: 1. Significantly enlarged prostate, measuring 6.4 cm in transverse dimension, and 7.6 cm in craniocaudal dimension. This may help explain the patient's difficulty urinating. Scattered calcification noted. 2. Foley catheter in place, with decompressed bladder. 3. Scattered calcification along the abdominal aorta and its branches. 4. Scattered diverticulosis along the proximal sigmoid colon, without evidence of diverticulitis.   Electronically Brandon Brandt  By: Garald Balding M.D.  On: 01/02/2015 01:28  Assessment & Plan:    1. Urinary retention- Acute urinary retention after  drinking excessive fluids for colonoscopy prep. Catheter removed today without difficulty for voiding trial. Starting on Tamsulosin in ED. Patient will return this afternoon for PVR.  2. BPH- Patient reports baseline LUTS prior to episode of retention. He will continue Flomax as prescribed and may need to begin Finasteride as well pending outcome of voiding trial.  RTC in 2 months for recheck.  There are no diagnoses linked to this encounter.  Return in about 2 months (around 03/11/2015) for PVR/DRE/I-PSS.  These notes generated with voice recognition software. I apologize for typographical errors.  Brandon Brandt, [facility] [address], [place], [place] [address] [phone]

## 2015-01-19 ENCOUNTER — Ambulatory Visit (INDEPENDENT_AMBULATORY_CARE_PROVIDER_SITE_OTHER): Payer: Medicare Other | Admitting: Obstetrics and Gynecology

## 2015-01-19 ENCOUNTER — Inpatient Hospital Stay: Payer: Medicare Other

## 2015-01-19 ENCOUNTER — Inpatient Hospital Stay
Admission: AD | Admit: 2015-01-19 | Discharge: 2015-01-20 | DRG: 728 | Disposition: A | Discharge status: Home Health | Payer: Medicare Other | Source: Ambulatory Visit | Attending: Urology | Admitting: Urology

## 2015-01-19 ENCOUNTER — Encounter: Payer: Self-pay | Admitting: Obstetrics and Gynecology

## 2015-01-19 VITALS — BP 151/81 | HR 86 | Resp 16 | Ht 69.5 in | Wt 223.4 lb

## 2015-01-19 DIAGNOSIS — N390 Urinary tract infection, site not specified: Secondary | ICD-10-CM

## 2015-01-19 DIAGNOSIS — Z79899 Other long term (current) drug therapy: Secondary | ICD-10-CM

## 2015-01-19 DIAGNOSIS — I10 Essential (primary) hypertension: Secondary | ICD-10-CM | POA: Diagnosis present

## 2015-01-19 DIAGNOSIS — N453 Epididymo-orchitis: Principal | ICD-10-CM | POA: Diagnosis present

## 2015-01-19 DIAGNOSIS — N5089 Other specified disorders of the male genital organs: Secondary | ICD-10-CM | POA: Diagnosis present

## 2015-01-19 DIAGNOSIS — Z87891 Personal history of nicotine dependence: Secondary | ICD-10-CM

## 2015-01-19 DIAGNOSIS — E7800 Pure hypercholesterolemia, unspecified: Secondary | ICD-10-CM | POA: Diagnosis present

## 2015-01-19 DIAGNOSIS — I2510 Atherosclerotic heart disease of native coronary artery without angina pectoris: Secondary | ICD-10-CM | POA: Diagnosis present

## 2015-01-19 DIAGNOSIS — R3129 Other microscopic hematuria: Secondary | ICD-10-CM | POA: Diagnosis not present

## 2015-01-19 DIAGNOSIS — N401 Enlarged prostate with lower urinary tract symptoms: Secondary | ICD-10-CM | POA: Diagnosis present

## 2015-01-19 DIAGNOSIS — Z95828 Presence of other vascular implants and grafts: Secondary | ICD-10-CM

## 2015-01-19 DIAGNOSIS — B9620 Unspecified Escherichia coli [E. coli] as the cause of diseases classified elsewhere: Secondary | ICD-10-CM | POA: Diagnosis present

## 2015-01-19 LAB — COMPREHENSIVE METABOLIC PANEL
ALBUMIN: 3.8 g/dL (ref 3.5–5.0)
ALT: 59 U/L (ref 17–63)
AST: 23 U/L (ref 15–41)
Alkaline Phosphatase: 56 U/L (ref 38–126)
Anion gap: 6 (ref 5–15)
BILIRUBIN TOTAL: 0.9 mg/dL (ref 0.3–1.2)
BUN: 15 mg/dL (ref 6–20)
CO2: 28 mmol/L (ref 22–32)
CREATININE: 0.64 mg/dL (ref 0.61–1.24)
Calcium: 9.1 mg/dL (ref 8.9–10.3)
Chloride: 102 mmol/L (ref 101–111)
GFR calc Af Amer: >60 mL/min (ref >60)
GLUCOSE: 98 mg/dL (ref 65–99)
Potassium: 3.7 mmol/L (ref 3.5–5.1)
Sodium: 136 mmol/L (ref 135–145)
TOTAL PROTEIN: 7.3 g/dL (ref 6.5–8.1)

## 2015-01-19 LAB — URINALYSIS, COMPLETE
BILIRUBIN UA: NEGATIVE
GLUCOSE, UA: NEGATIVE
KETONES UA: NEGATIVE
Nitrite, UA: POSITIVE — AB
PROTEIN UA: NEGATIVE
SPEC GRAV UA: 1.02 (ref 1.005–1.030)
UUROB: 0.2 mg/dL (ref 0.2–1.0)
pH, UA: 7 (ref 5.0–7.5)

## 2015-01-19 LAB — MICROSCOPIC EXAMINATION
Epithelial Cells (non renal): NONE SEEN /hpf (ref 0–10)
RBC, UA: NONE SEEN /hpf (ref 0–?)
WBC, UA: >30 /hpf — ABNORMAL HIGH (ref 0–?)

## 2015-01-19 LAB — PROTIME-INR
INR: 1.04
PROTHROMBIN TIME: 13.8 s (ref 11.4–15.0)

## 2015-01-19 LAB — CBC WITH DIFFERENTIAL/PLATELET
BASOS PCT: 0 %
Basophils Absolute: 0 10*3/uL (ref 0–0.1)
Eosinophils Absolute: 0.1 10*3/uL (ref 0–0.7)
Eosinophils Relative: 0 %
HEMATOCRIT: 41.8 % (ref 40.0–52.0)
HEMOGLOBIN: 13.9 g/dL (ref 13.0–18.0)
LYMPHS ABS: 1.1 10*3/uL (ref 1.0–3.6)
Lymphocytes Relative: 6 %
MCH: 30.2 pg (ref 26.0–34.0)
MCHC: 33.3 g/dL (ref 32.0–36.0)
MCV: 90.6 fL (ref 80.0–100.0)
MONO ABS: 1.4 10*3/uL — AB (ref 0.2–1.0)
MONOS PCT: 7 %
NEUTROS ABS: 16.5 10*3/uL — AB (ref 1.4–6.5)
NEUTROS PCT: 87 %
Platelets: 227 10*3/uL (ref 150–440)
RBC: 4.62 MIL/uL (ref 4.40–5.90)
RDW: 13.4 % (ref 11.5–14.5)
WBC: 19 10*3/uL — ABNORMAL HIGH (ref 3.8–10.6)

## 2015-01-19 LAB — BLADDER SCAN AMB NON-IMAGING

## 2015-01-19 MED ORDER — SODIUM CHLORIDE 0.9 % IV SOLN
1.0000 g | INTRAVENOUS | Status: DC
  Administered 2015-01-19: 1 g via INTRAVENOUS
  Filled 2015-01-19 (×2): qty 1

## 2015-01-19 MED ORDER — SODIUM CHLORIDE 0.9 % IV SOLN
INTRAVENOUS | Status: DC
  Administered 2015-01-19 – 2015-01-20 (×3): via INTRAVENOUS

## 2015-01-19 MED ORDER — HEPARIN SODIUM (PORCINE) 5000 UNIT/ML IJ SOLN
5000.0000 [IU] | Freq: Three times a day (TID) | INTRAMUSCULAR | Status: DC
  Administered 2015-01-19 – 2015-01-20 (×2): 5000 [IU] via SUBCUTANEOUS
  Filled 2015-01-19: qty 1

## 2015-01-19 MED ORDER — ONDANSETRON HCL 4 MG/2ML IJ SOLN: 4.0000 mg | INTRAMUSCULAR | Status: DC | PRN

## 2015-01-19 MED ORDER — HYDROCODONE-ACETAMINOPHEN 5-325 MG PO TABS
1.0000 | ORAL_TABLET | ORAL | Status: DC | PRN
  Administered 2015-01-19 – 2015-01-20 (×3): 1 via ORAL
  Filled 2015-01-19 (×3): qty 1

## 2015-01-19 MED ORDER — LISINOPRIL 20 MG PO TABS
20.0000 mg | ORAL_TABLET | Freq: Every day | ORAL | Status: DC
  Administered 2015-01-19 – 2015-01-20 (×2): 20 mg via ORAL
  Filled 2015-01-19 (×2): qty 1

## 2015-01-19 MED ORDER — TAMSULOSIN HCL 0.4 MG PO CAPS
0.4000 mg | ORAL_CAPSULE | Freq: Every day | ORAL | Status: DC
  Administered 2015-01-19 – 2015-01-20 (×2): 0.4 mg via ORAL
  Filled 2015-01-19 (×2): qty 1

## 2015-01-19 NOTE — Progress Notes (Signed)
ANTIBIOTIC CONSULT NOTE - INITIAL  Pharmacy Consult for Renal adjustment of Antibiotic- Ertapenem Indication: Resistant UTI  No Known Allergies  Patient Measurements: Height: 5\' 10"  (177.8 cm) Weight: 218 lb 11.2 oz (99.202 kg) IBW/kg (Calculated) : 73 Adjusted Body Weight: 83.5 kg  Vital Signs: Temp: 98.9 F (37.2 C) (11/10 1333) Temp Source: Oral (11/10 1333) BP: 136/55 mmHg (11/10 1333) Pulse Rate: 76 (11/10 1333) Intake/Output from previous day:   Intake/Output from this shift:    Labs:  Recent Labs  01/19/15 1501  WBC 19.0*  HGB 13.9  PLT 227  CREATININE 0.64   Estimated Creatinine Clearance: 107.3 mL/min (by C-G formula based on Cr of 0.64).  Microbiology: Recent Results (from the past 720 hour(s))  Microscopic Examination     Status: Abnormal   Collection Time: 01/19/15 11:30 AM  Result Value Ref Range Status   WBC, UA >30 (H) 0 -  5 /hpf Final   RBC, UA None seen 0 -  2 /hpf Final   Epithelial Cells (non renal) None seen 0 - 10 /hpf Final   Bacteria, UA Many (A) None seen/Few Final    Medical History: Past Medical History  Diagnosis Date  . Hypertension   . Coronary artery disease   . Pure hypercholesterolemia   . Impaired glucose tolerance   . Shortness of breath dyspnea   . Adenomatous polyps     Medications:  Scheduled:  . ertapenem  1 g Intravenous Q24H  . heparin  5,000 Units Subcutaneous 3 times per day   Anti-infectives    Start     Dose/Rate Route Frequency Ordered Stop   01/19/15 1545  ertapenem (INVANZ) 1 g in sodium chloride 0.9 % 50 mL IVPB     1 g 100 mL/hr over 30 Minutes Intravenous Every 24 hours 01/19/15 1543       Assessment: 69 yo Male admitted with UTI/Epididymitis/Orchitis. Urine cx from 11/7 office visit is resistant to all abx except Ertapenem/Imipenem/Gentamicin. Elenor Quinones clinic telephone note 01/16/15 for culture result*)   ID consult ordered. Gentamicin consult ordered. Spoke with Dr. Erlene Quan concerning  sensitivities and will change to Ertapenem at this time.  F/U Urine cx from today 11/10.  Plan:  Will begin Ertapenem 1 gram IV Q24h for resistant UTI.   Scr 0.64 Crcl 107 ml/min  Chinita Greenland PharmD Clinical Pharmacist 01/19/2015 3:51 PM

## 2015-01-19 NOTE — Progress Notes (Signed)
01/19/2015 11:37 AM   Brandon Brandt Oct 03, 1945 [id number]  Referring provider: Katheren Shams [address] [place], [place] [address]  Chief Complaint  Patient presents with  . Swollen testicles    Right testicle per pt  . Benign Prostatic Hypertrophy    HPI: Patient is a 68 year old male with a history of BPH,  hypertension, CAD, and hypercholesterolemia presenting today with complaints of right scrotal swelling and urinary tract infection. He recently experienced an acute episode of urinary retention after drinking large amounts of fluids for a bowel prep.  He presented to our office for follow up and voiding trial 10 days ago. Foley catheter was removed and patient was started on Flomax on 01/09/15. He states that he experienced chills approximately 4 days ago. He presented to his primary care providers office 3 days ago after noticing some spots of blood in his underwear and was diagnosed with a urinary tract infection and started on by mouth Cipro. His culture returned positive for E. Coli resistant to Cipro as well as multiple other oral antibiotics. He reports that right-sided scrotal swelling began yesterday and has become increasingly worse. He denies dysuria or gross hematuria but reports continued urinary frequency every 2 hours. He denies fevers, chills, nausea, vomiting or night sweats over the last 24-48 hours.   Previous CT renal stone protocol 01/02/15 noting a significantly enlarged prostate, measuring 6.4 cm in transverse dimension, and 7.6 cm in craniocaudal dimension. No stones or other GU pathology  Baseline urinary symptoms include sensation of incomplete bladder emptying, frequency, intermittency, urgency, weak stream, and nocturia up to 3 times per night.   I-PSS 15 QOL- terrible   PMH: Past Medical History  Diagnosis Date  . Hypertension   . Coronary artery disease   . Pure hypercholesterolemia   . Impaired glucose tolerance   . Shortness of  breath dyspnea   . Adenomatous polyps     Surgical History: Past Surgical History  Procedure Laterality Date  . Colonoscopy    . Cardiac catheterization    . Colonoscopy with propofol N/A 01/02/2015    Procedure: COLONOSCOPY WITH PROPOFOL;  Surgeon: Manya Silvas, MD;  Location: [facility] ENDOSCOPY;  Service: Endoscopy;  Laterality: N/A;    Home Medications:    Medication List       This list is accurate as of: 01/19/15 11:37 AM.  Always use your most recent med list.               albuterol 108 (90 BASE) MCG/ACT inhaler  Commonly known as:  PROVENTIL HFA;VENTOLIN HFA  Inhale into the lungs every 6 (six) hours as needed for wheezing or shortness of breath.     ascorbic acid 500 MG tablet  Commonly known as:  VITAMIN C  Take 500 mg by mouth daily.     aspirin 81 MG tablet  Take 81 mg by mouth daily.     ciprofloxacin 500 MG tablet  Commonly known as:  CIPRO  Take 1 tablet (500 mg total) by mouth 2 (two) times daily.     lisinopril-hydrochlorothiazide 20-12.5 MG tablet  Commonly known as:  PRINZIDE,ZESTORETIC  Take 1 tablet by mouth daily.     multivitamin tablet  Take 1 tablet by mouth daily.     simvastatin 80 MG tablet  Commonly known as:  ZOCOR  Take 80 mg by mouth daily.     SM OMEGA-3-6-9 FATTY ACIDS PO  Take by mouth.     tamsulosin 0.4 MG Caps capsule  Commonly known as:  FLOMAX  Take 1 capsule (0.4 mg total) by mouth daily.     vitamin E 400 UNIT capsule  Generic drug:  vitamin E  Take 400 Units by mouth daily.        Allergies: No Known Allergies  Family History: Family History  Problem Relation Age of Onset  . Prostate cancer Father     Social History:  reports that he has quit smoking. His smoking use included Cigarettes. He does not have any smokeless tobacco history on file. He reports that he does not drink alcohol or use illicit drugs.  ROS: UROLOGY Frequent Urination?: Yes Hard to postpone urination?: No Burning/pain with  urination?: No Get up at night to urinate?: Yes Leakage of urine?: No Urine stream starts and stops?: No Trouble starting stream?: No Do you have to strain to urinate?: No Blood in urine?: No Urinary tract infection?: Yes Sexually transmitted disease?: No Injury to kidneys or bladder?: No Painful intercourse?: No Weak stream?: No Erection problems?: No Penile pain?: No  Gastrointestinal Nausea?: No Vomiting?: No Indigestion/heartburn?: No Diarrhea?: No Constipation?: No  Constitutional Fever: No Night sweats?: No Weight loss?: No Fatigue?: No  Skin Skin rash/lesions?: No Itching?: No  Eyes Blurred vision?: No Double vision?: No  Ears/Nose/Throat Sore throat?: No Sinus problems?: No  Hematologic/Lymphatic Swollen glands?: No Easy bruising?: No  Cardiovascular Leg swelling?: No Chest pain?: No  Respiratory Cough?: No Shortness of breath?: No  Endocrine Excessive thirst?: No  Musculoskeletal Back pain?: No Joint pain?: No  Neurological Headaches?: Yes Dizziness?: No  Psychologic Depression?: No Anxiety?: No  Physical Exam: BP 151/81 mmHg  Pulse 86  Resp 16  Ht 5' 9.5" (1.765 m)  Wt 223 lb 6.4 oz (101.334 kg)  BMI 32.53 kg/m2  Constitutional:  Alert and oriented, No acute distress. HEENT: [place] AT, moist mucus membranes.  Trachea midline, no masses. Cardiovascular: No clubbing, cyanosis, or edema. Respiratory: Normal respiratory effort, no increased work of breathing. GU: testicles descended bilaterally, erythema of right hemiscrotum, swelling of right testicle and epididymis extending up the right spermatic cord,  right scrotum TTP, left testicle normal without palpable masses Skin: No rashes, bruises or suspicious lesions. Lymph: No cervical or inguinal adenopathy. Neurologic: Grossly intact, no focal deficits, moving all 4 extremities. Psychiatric: Normal mood and affect.  Laboratory Data:   Urinalysis Results for orders placed or  performed in visit on 01/19/15  Microscopic Examination  Result Value Ref Range   WBC, UA >30 (H) 0 -  5 /hpf   RBC, UA None seen 0 -  2 /hpf   Epithelial Cells (non renal) None seen 0 - 10 /hpf   Bacteria, UA Many (A) None seen/Few  Urinalysis, Complete  Result Value Ref Range   Specific Gravity, UA 1.020 1.005 - 1.030   pH, UA 7.0 5.0 - 7.5   Color, UA Yellow Yellow   Appearance Ur Cloudy (A) Clear   Leukocytes, UA 3+ (A) Negative   Protein, UA Negative Negative/Trace   Glucose, UA Negative Negative   Ketones, UA Negative Negative   RBC, UA Trace (A) Negative   Bilirubin, UA Negative Negative   Urobilinogen, Ur 0.2 0.2 - 1.0 mg/dL   Nitrite, UA Positive (A) Negative   Microscopic Examination See below:   BLADDER SCAN AMB NON-IMAGING  Result Value Ref Range   Scan Result 71 mL    Pertinent Imaging:   Assessment & Plan:    1. Epididymitis/Orchitis-  UA showing greater than  30 WBCs, 0 RBCs, many bacteria, nitrite positive. Recent urine culture noting pan resistant  E. coli. Patient had significant right testicular and epididymal swelling and right hemiscrotum erythema on exam. Given the highly resistant Escherichia coli noted on recent urine culture patient will be directly admitted for IV antibiotics today. - Urinalysis, Complete - BLADDER SCAN AMB NON-IMAGING  2. Urinary Tract Infection- urine culture collected on 01/16/15 positive for pan resistant Escherichia coli. Patient instructed to present to the hospital for direct admission for IV antibiotic therapy. Plan to place PICC tomorrow if possible and DC home with IV antibiotics. Will consult infectious disease while patient is admitted.  3. Urinary retention- History of acute urinary retention after drinking excessive fluids for colonoscopy prep. Successful voiding trial 01/09/15. PVR 71 mL's today.  2. BPH- Patient reports baseline LUTS prior to episode of retention.  Currently managed with Flomax daily. Patient reports  continued urinary frequency that this may be related to his current urinary tract infection.  Patient was also seen and examined by Dr. Erlene Quan and she is in agreement with assessment and plan of care.  No Follow-up on file.  These notes generated with voice recognition software. I apologize for typographical errors.  Herbert Moors, [facility] [address] [place], [place] [address] [phone]

## 2015-01-19 NOTE — H&P (Signed)
01/19/2015 11:37 AM   Brandon Brandt April 20, 1945 [id number]   Chief Complaint  Patient presents with  . Swollen testicles    Right testicle per pt  . Benign Prostatic Hypertrophy    Admission H&P Patient is a 69 year old male with a history of BPH,  hypertension, CAD, and hypercholesterolemia presenting today with complaints of right scrotal swelling and urinary tract infection. He recently experienced an acute episode of urinary retention after drinking large amounts of fluids for a colonoscopy bowel prep.  He presented to our office for follow up and voiding trial 10 days ago. Voiding trial was successful and patient was started on Flomax on 01/09/15.  He states that he begin to experience chills approximately 4 days ago. He presented to his primary care providers office Monday after noticing some spots of blood in his underwear and was diagnosed with a urinary tract infection and started on by mouth Cipro. His culture returned positive for E. Coli resistant to Cipro as well as multiple other oral antibiotics. Additional complaints include worsening right-sided scrotal swelling begining yesterday and continued urinary frequency.  He denies dysuria, gross hematuria, fevers, chills, nausea, vomiting or night sweats over the last 24-48 hours.   Previous CT renal stone protocol 01/02/15 noting a significantly enlarged prostate, measuring 6.4 cm in transverse dimension, and 7.6 cm in craniocaudal dimension. No stones or other GU pathology  Baseline urinary symptoms include sensation of incomplete bladder emptying, frequency, intermittency, urgency, weak stream, and nocturia up to 3 times per night.   I-PSS 15 QOL- terrible   PMH: Past Medical History  Diagnosis Date  . Hypertension   . Coronary artery disease   . Pure hypercholesterolemia   . Impaired glucose tolerance   . Shortness of breath dyspnea   . Adenomatous polyps     Surgical History: Past Surgical History  Procedure  Laterality Date  . Colonoscopy    . Cardiac catheterization    . Colonoscopy with propofol N/A 01/02/2015    Procedure: COLONOSCOPY WITH PROPOFOL;  Surgeon: Manya Silvas, MD;  Location: [facility] ENDOSCOPY;  Service: Endoscopy;  Laterality: N/A;    Home Medications:    Medication List       This list is accurate as of: 01/19/15 11:37 AM.  Always use your most recent med list.               albuterol 108 (90 BASE) MCG/ACT inhaler  Commonly known as:  PROVENTIL HFA;VENTOLIN HFA  Inhale into the lungs every 6 (six) hours as needed for wheezing or shortness of breath.     ascorbic acid 500 MG tablet  Commonly known as:  VITAMIN C  Take 500 mg by mouth daily.     aspirin 81 MG tablet  Take 81 mg by mouth daily.     ciprofloxacin 500 MG tablet  Commonly known as:  CIPRO  Take 1 tablet (500 mg total) by mouth 2 (two) times daily.     lisinopril-hydrochlorothiazide 20-12.5 MG tablet  Commonly known as:  PRINZIDE,ZESTORETIC  Take 1 tablet by mouth daily.     multivitamin tablet  Take 1 tablet by mouth daily.     simvastatin 80 MG tablet  Commonly known as:  ZOCOR  Take 80 mg by mouth daily.     SM OMEGA-3-6-9 FATTY ACIDS PO  Take by mouth.     tamsulosin 0.4 MG Caps capsule  Commonly known as:  FLOMAX  Take 1 capsule (0.4 mg total) by mouth daily.  vitamin E 400 UNIT capsule  Generic drug:  vitamin E  Take 400 Units by mouth daily.        Allergies: No Known Allergies  Family History: Family History  Problem Relation Age of Onset  . Prostate cancer Father     Social History:  reports that he has quit smoking. His smoking use included Cigarettes. He does not have any smokeless tobacco history on file. He reports that he does not drink alcohol or use illicit drugs.  ROS: UROLOGY Frequent Urination?: Yes Hard to postpone urination?: No Burning/pain with urination?: No Get up at night to urinate?: Yes Leakage of urine?: No Urine stream starts and  stops?: No Trouble starting stream?: No Do you have to strain to urinate?: No Blood in urine?: No Urinary tract infection?: Yes Sexually transmitted disease?: No Injury to kidneys or bladder?: No Painful intercourse?: No Weak stream?: No Erection problems?: No Penile pain?: No  Gastrointestinal Nausea?: No Vomiting?: No Indigestion/heartburn?: No Diarrhea?: No Constipation?: No  Constitutional Fever: No Night sweats?: No Weight loss?: No Fatigue?: No  Skin Skin rash/lesions?: No Itching?: No  Eyes Blurred vision?: No Double vision?: No  Ears/Nose/Throat Sore throat?: No Sinus problems?: No  Hematologic/Lymphatic Swollen glands?: No Easy bruising?: No  Cardiovascular Leg swelling?: No Chest pain?: No  Respiratory Cough?: No Shortness of breath?: No  Endocrine Excessive thirst?: No  Musculoskeletal Back pain?: No Joint pain?: No  Neurological Headaches?: Yes Dizziness?: No  Psychologic Depression?: No Anxiety?: No  Physical Exam: BP 151/81 mmHg  Pulse 86  Resp 16  Ht 5' 9.5" (1.765 m)  Wt 223 lb 6.4 oz (101.334 kg)  BMI 32.53 kg/m2  Constitutional:  Alert and oriented, No acute distress. HEENT: [place] AT, moist mucus membranes.  Trachea midline, no masses. Cardiovascular: No clubbing, cyanosis, or edema. Respiratory: Normal respiratory effort, no increased work of breathing. GU: testicles descended bilaterally, erythema of right hemiscrotum, swelling of right testicle and epididymis extending up the right spermatic cord,  right scrotum TTP, left testicle normal without palpable masses Skin: No rashes, bruises or suspicious lesions. Lymph: No cervical or inguinal adenopathy. Neurologic: Grossly intact, no focal deficits, moving all 4 extremities. Psychiatric: Normal mood and affect.  Laboratory Data:   Urinalysis Results for orders placed or performed in visit on 01/19/15  Microscopic Examination  Result Value Ref Range   WBC, UA >30  (H) 0 -  5 /hpf   RBC, UA None seen 0 -  2 /hpf   Epithelial Cells (non renal) None seen 0 - 10 /hpf   Bacteria, UA Many (A) None seen/Few  Urinalysis, Complete  Result Value Ref Range   Specific Gravity, UA 1.020 1.005 - 1.030   pH, UA 7.0 5.0 - 7.5   Color, UA Yellow Yellow   Appearance Ur Cloudy (A) Clear   Leukocytes, UA 3+ (A) Negative   Protein, UA Negative Negative/Trace   Glucose, UA Negative Negative   Ketones, UA Negative Negative   RBC, UA Trace (A) Negative   Bilirubin, UA Negative Negative   Urobilinogen, Ur 0.2 0.2 - 1.0 mg/dL   Nitrite, UA Positive (A) Negative   Microscopic Examination See below:   BLADDER SCAN AMB NON-IMAGING  Result Value Ref Range   Scan Result 71 mL    Pertinent Imaging:   Assessment & Plan: 69 year old male with a history of BPH and recent episode of acute urinary retention requiring Foley placement. Patient developed urinary tract infection and subsequent right epididymal orchitis after Foley  catheter removal. Recent urine culture positive for pan resistant Escherichia coli. He is being admitted today for menstruation of IV antibiotic therapy and PICC line placement for home administration of antibiotics after discharge.   1. Epididymitis/Orchitis-  UA showing greater than 30 WBCs, 0 RBCs, many bacteria, nitrite positive. Recent urine culture noting pan resistant  E. coli. Patient had significant right testicular and epididymal swelling and right hemiscrotum erythema on exam. Given the highly resistant Escherichia coli noted on recent urine culture patient will receive IV Gentamicin while inpatient and d/c with home infusions per ID reccommendations. - Urinalysis, Complete - BLADDER SCAN AMB NON-IMAGING  2. Urinary Tract Infection- Urine culture collected on 01/16/15 positive for pan resistant Escherichia coli. Patient instructed to present to the hospital for direct admission for IV antibiotic therapy. Plan to place PICC tomorrow if possible and  DC home with IV antibiotics. Will consult infectious disease while inpatient.  3. Urinary retention- History of acute urinary retention after drinking excessive fluids for colonoscopy prep. Successful voiding trial 01/09/15. PVR 71 mL's today.  2. BPH- Patient reports baseline LUTS prior to episode of retention.  Currently managed with Flomax daily. Patient reports continued urinary frequency that this may be related to his current urinary tract infection.  Patient was also seen and examined by Dr. Erlene Quan and she is in agreement with assessment and plan of care.  These notes generated with voice recognition software. I apologize for typographical errors.  Herbert Moors, [facility] [address], [place], [place] [address] [phone]   I have seen and examined the patient, labs/ imaging reviewed and discussed his management with Mrs. Overton.  I reviewed the resident's note and agree with the documented findings and plan of care.    Hollice Espy, MD .

## 2015-01-20 DIAGNOSIS — N453 Epididymo-orchitis: Principal | ICD-10-CM

## 2015-01-20 MED ORDER — IBUPROFEN 800 MG PO TABS: 800.0000 mg | ORAL_TABLET | Freq: Three times a day (TID) | ORAL | Status: DC | PRN

## 2015-01-20 MED ORDER — SODIUM CHLORIDE 0.9 % IV SOLN
1.0000 g | INTRAVENOUS | Status: DC
  Administered 2015-01-20: 1 g via INTRAVENOUS
  Filled 2015-01-20: qty 1

## 2015-01-20 NOTE — Discharge Instructions (Signed)
Please monitor for fevers. Seek immediate attention for fevers, uncontrolled pain or other concerning symptoms. Continue IV antibiotic therapy at home as directed by home health nurse. Please follow up next week at [facility] urological for recheck.

## 2015-01-20 NOTE — Progress Notes (Addendum)
Infectious Disease Long Term IV Antibiotic Orders  Diagnosis: UTI, orchitis, epidymitiis  Culture results ESBL E coli  Allergies: No Known Allergies  Discharge antibiotics Meropenem 1000 mg every 8 hours  PICC Care per protocol Labs weekly while on IV antibiotics      FAX weekly labs to [phone] CBC w diff   Comprehensive met panel  Planned duration of antibiotics - 3 weeks  Stop date Feb 09 2015 Follow up clinic date [facility]- No scheduled ID follow up needed   Leonel Ramsay, MD

## 2015-01-20 NOTE — Progress Notes (Addendum)
Patient A/O, no noted distress. Patient notes only in minimal pain. NPO after midnight. Right testicles redness, swollen. Patient ambulated to the bathroom without any complications. Held heparin resulted in PICC placement.  Tolerated meds well. Patient was educated on medications and PICC placement prior/after. PICC is proper placement noted by xray. Staff will continue to monitor and meet needs.

## 2015-01-20 NOTE — Discharge Summary (Signed)
Date of admission: 01/19/2015  Date of discharge: 01/20/2015  Admission diagnosis: Epididymitis and Orchitis  Discharge diagnosis: Epididymitis and Orchitis  Secondary diagnoses:  Patient Active Problem List   Diagnosis Date Noted  . Orchitis and epididymitis 01/19/2015    History and Physical: For full details, please see admission history and physical. Briefly, Brandon Brandt is a 69 y.o. year old patient with epididymo-orchitis and ESBL  UTI admitted for initiation of IV antibiotics.  PICC line was placed and patient will complete therapy as outpatient. He remained afebrile. Exam stable. ID was consulted.  Hospital Course: Patient tolerated the procedure well.  He was then transferred to the floor after an uneventful PACU stay.  His hospital course was uncomplicated.    Laboratory values:   Recent Labs  01/19/15 1501  WBC 19.0*  HGB 13.9  HCT 41.8    Recent Labs  01/19/15 1501  NA 136  K 3.7  CL 102  CO2 28  GLUCOSE 98  BUN 15  CREATININE 0.64  CALCIUM 9.1    Recent Labs  01/19/15 1501  INR 1.04   No results for input(s): LABURIN in the last 72 hours. Results for orders placed or performed in visit on 01/19/15  Microscopic Examination     Status: Abnormal   Collection Time: 01/19/15 11:30 AM  Result Value Ref Range Status   WBC, UA >30 (H) 0 -  5 /hpf Final   RBC, UA None seen 0 -  2 /hpf Final   Epithelial Cells (non renal) None seen 0 - 10 /hpf Final   Bacteria, UA Many (A) None seen/Few Final    Disposition: Home  Discharge instruction: Patient instructed to take Motrin prn for pain.  Monitor for fevers and seek immediate medical attention for fever or worsening of symptoms.  Discharge medications:   Medication List    STOP taking these medications        ciprofloxacin 500 MG tablet  Commonly known as:  CIPRO      TAKE these medications        albuterol 108 (90 BASE) MCG/ACT inhaler  Commonly known as:  PROVENTIL HFA;VENTOLIN HFA  Inhale  into the lungs every 6 (six) hours as needed for wheezing or shortness of breath.     ascorbic acid 500 MG tablet  Commonly known as:  VITAMIN C  Take 500 mg by mouth daily.     aspirin 81 MG tablet  Take 81 mg by mouth daily.     ibuprofen 800 MG tablet  Commonly known as:  ADVIL,MOTRIN  Take 1 tablet (800 mg total) by mouth every 8 (eight) hours as needed for mild pain or moderate pain.     lisinopril-hydrochlorothiazide 20-12.5 MG tablet  Commonly known as:  PRINZIDE,ZESTORETIC  Take 1 tablet by mouth daily.     multivitamin tablet  Take 1 tablet by mouth daily.     simvastatin 80 MG tablet  Commonly known as:  ZOCOR  Take 80 mg by mouth daily.     SM OMEGA-3-6-9 FATTY ACIDS PO  Take by mouth.     tamsulosin 0.4 MG Caps capsule  Commonly known as:  FLOMAX  Take 1 capsule (0.4 mg total) by mouth daily.     vitamin E 400 UNIT capsule  Generic drug:  vitamin E  Take 400 Units by mouth daily.        Followup:      Follow-up Information    Follow up with [place].  Schedule an appointment as soon as possible for a visit in 4 days.   Specialty:  Urology   Contact information:   [address] [place] [phone]

## 2015-01-20 NOTE — Progress Notes (Addendum)
Urology Consult Follow Up  Subjective: Patient is a 69yo male admitted for treatment of ESBL E. Coli UTI and epididymo-orchitis. He has been receiving IV ertapenem and tolerating it well.  Patient reports overall feeling well today.  He does reports a slight increase in scrotal pain.  Did take Vicodin once last night. Patient afebrile.  Anti-infectives: Anti-infectives    Start     Dose/Rate Route Frequency Ordered Stop   01/20/15 1800  ertapenem (INVANZ) 1 g in sodium chloride 0.9 % 50 mL IVPB     1 g 100 mL/hr over 30 Minutes Intravenous Every 24 hours 01/20/15 1052     01/19/15 1545  ertapenem (INVANZ) 1 g in sodium chloride 0.9 % 50 mL IVPB  Status:  Discontinued     1 g 100 mL/hr over 30 Minutes Intravenous Every 24 hours 01/19/15 1543 01/20/15 1052      Current Facility-Administered Medications  Medication Dose Route Frequency Provider Last Rate Last Dose  . 0.9 %  sodium chloride infusion   Intravenous Continuous Roda Shutters, FNP 100 mL/hr at 01/20/15 0900    . ertapenem (INVANZ) 1 g in sodium chloride 0.9 % 50 mL IVPB  1 g Intravenous Q24H Charlett Nose, RPH      . heparin injection 5,000 Units  5,000 Units Subcutaneous 3 times per day Roda Shutters, FNP   5,000 Units at 01/20/15 [phone]  . HYDROcodone-acetaminophen (NORCO/VICODIN) 5-325 MG per tablet 1-2 tablet  1-2 tablet Oral Q4H PRN Roda Shutters, FNP   1 tablet at 01/20/15 [id number]  . lisinopril (PRINIVIL,ZESTRIL) tablet 20 mg  20 mg Oral Daily Festus Aloe, MD   20 mg at 01/20/15 0857  . ondansetron (ZOFRAN) injection 4 mg  4 mg Intravenous Q4H PRN Roda Shutters, FNP      . tamsulosin [facility]) capsule 0.4 mg  0.4 mg Oral Daily Festus Aloe, MD   0.4 mg at 01/20/15 0857     Objective: Vital signs in last 24 hours: Temp:  [97.6 F (36.4 C)-98.9 F (37.2 C)] 97.6 F (36.4 C) (11/11 0536) Pulse Rate:  [76-83] 77 (11/11 0536) Resp:  [14-16] 16 (11/11 0536) BP: (118-136)/(50-55) 118/50 mmHg (11/11  0536) SpO2:  [97 %] 97 % (11/11 0536) Weight:  [218 lb 11.2 oz (99.202 kg)] 218 lb 11.2 oz (99.202 kg) (11/10 1348)  Intake/Output from previous day: 11/10 0701 - 11/11 0700 In: [id number] [P.O.:240; I.V.:1355] Out: 600 [Urine:600] Intake/Output this shift: Total I/O In: 588.3 [I.V.:588.3] Out: 500 [Urine:500]   Physical Exam  Lab Results:   Recent Labs  01/19/15 1501  WBC 19.0*  HGB 13.9  HCT 41.8  PLT 227   BMET  Recent Labs  01/19/15 1501  NA 136  K 3.7  CL 102  CO2 28  GLUCOSE 98  BUN 15  CREATININE 0.64  CALCIUM 9.1   PT/INR  Recent Labs  01/19/15 1501  LABPROT 13.8  INR 1.04   ABG No results for input(s): PHART, HCO3 in the last 72 hours.  Invalid input(s): PCO2, PO2  Studies/Results: Dg Chest Port 1 View  01/19/2015  CLINICAL DATA:  PICC line placement. EXAM: PORTABLE CHEST 1 VIEW COMPARISON:  None available. FINDINGS: The cardiac silhouette, mediastinal and hilar contours are within normal limits. The lungs are clear. The left PICC line tip is in the distal SVC near the cavoatrial junction. No complicating features. Remote healed right rib fractures are noted. IMPRESSION: Left PICC line tip is in the distal SVC.  No complicating features. No acute cardiopulmonary findings. Electronically Signed   By: Marijo Sanes M.D.   On: 01/19/2015 23:30    Assessment:  1. UTI- ESBL E. Coli UTI s/p acute urinary retention requiring short term Foley catheter. Patient tolerating IV Ertapenem therapy well. Scrotal swelling and erythema stable.  2. Epididymitis/Orchitis-  Sequela of ESBL E. Coli positive UTI.  Swelling and pain stable today. No systemic symptoms.  Plan:  1. UTI and Epididymitis/Orchitis-  We will continue IV antibiotic therapy as out patient with Meropenem (due to insurance coverage) via PICC line for 21 days per ID.  Patient needs to follow up in at our office early next week for a recheck.    [facility] [address] [place], [place] [address] [phone]    LOS: 1 day    Herbert Moors 01/20/2015   Patient seen and examined today. Agree with above assessment and plan by Carlyle Lipa. Patient with PICC line started on IV antibiotics for ESBL Escherichia coli. ID consult pending. Plan to arrange for IV antibiotics as outpatient, into SPECT discharge today. All of the patient's questions were answered in detail.  Hollice Espy, MD

## 2015-01-20 NOTE — Care Management Important Message (Signed)
Important Message  Patient Details  Name: Brandon Brandt MRN: [id number] Date of Birth: November 17, 1945   Medicare Important Message Given:  Yes    Alvie Heidelberg, RN 01/20/2015, 1:13 PM

## 2015-01-20 NOTE — Consult Note (Signed)
[facility] Infectious Disease     Reason for Consult: ESBL UTI, orchitis    Referring Physician: Hollice Espy Date of Admission:  01/19/2015   Active Problems:   Orchitis and epididymitis  HPI: Brandon Brandt is a 69 y.o. male with hx of BPH who had recent foley placed for acute urinary retention. He was on abx as well from ED> CT showed prostatomegaly.  Had cath removed with voiding trial. Developed  had fevers chills and seen by PCP with +UA and UCX sent. Started cipro but cx with ESBL.  He developed R scotal swelling as well so admitted by urology Feels a little better with ertapenem.    Past Medical History  Diagnosis Date  . Hypertension   . Coronary artery disease   . Pure hypercholesterolemia   . Impaired glucose tolerance   . Shortness of breath dyspnea   . Adenomatous polyps    Past Surgical History  Procedure Laterality Date  . Colonoscopy    . Cardiac catheterization    . Colonoscopy with propofol N/A 01/02/2015    Procedure: COLONOSCOPY WITH PROPOFOL;  Surgeon: Manya Silvas, MD;  Location: [facility] ENDOSCOPY;  Service: Endoscopy;  Laterality: N/A;   Social History  Substance Use Topics  . Smoking status: Former Smoker    Types: Cigarettes  . Smokeless tobacco: None  . Alcohol Use: No   Family History  Problem Relation Age of Onset  . Prostate cancer Father     Allergies: No Known Allergies  Current antibiotics: Antibiotics Given (last 72 hours)    Date/Time Action Medication Dose Rate   01/19/15 1724 Given   ertapenem (INVANZ) 1 g in sodium chloride 0.9 % 50 mL IVPB 1 g 100 mL/hr   01/20/15 1504 Given   ertapenem (INVANZ) 1 g in sodium chloride 0.9 % 50 mL IVPB 1 g 100 mL/hr      MEDICATIONS: . ertapenem  1 g Intravenous Q24H  . heparin  5,000 Units Subcutaneous 3 times per day  . lisinopril  20 mg Oral Daily  . tamsulosin  0.4 mg Oral Daily    Review of Systems - 11 systems reviewed and negative per HPI   OBJECTIVE: Temp:  [97.6 F  (36.4 C)-98.4 F (36.9 C)] 97.6 F (36.4 C) (11/11 0536) Pulse Rate:  [77-83] 77 (11/11 0536) Resp:  [16] 16 (11/11 0536) BP: (118-134)/(50-51) 118/50 mmHg (11/11 0536) SpO2:  [97 %] 97 % (11/11 0536) Physical Exam  Constitutional: He is oriented to person, place, and time. He appears well-developed and well-nourished. No distress.  HENT:  Mouth/Throat: Oropharynx is clear and moist. No oropharyngeal exudate.  Cardiovascular: Normal rate, regular rhythm and normal heart sounds. Exam reveals no gallop and no friction rub.  No murmur heard.  Pulmonary/Chest: Effort normal and breath sounds normal. No respiratory distress. He has no wheezes.  Abdominal: Soft. Bowel sounds are normal. He exhibits no distension. There is no tenderness.  Lymphadenopathy: He has no cervical adenopathy.  GU, R testicle is red, swollen markedly enlarged, firm Neurological: He is alert and oriented to person, place, and time.  Skin: Skin is warm and dry. No rash noted. No erythema.  Psychiatric: He has a normal mood and affect. His behavior is normal.     LABS: Results for orders placed or performed during the hospital encounter of 01/19/15 (from the past 48 hour(s))  Comprehensive metabolic panel     Status: None   Collection Time: 01/19/15  3:01 PM  Result Value Ref  Range   Sodium 136 135 - 145 mmol/L   Potassium 3.7 3.5 - 5.1 mmol/L   Chloride 102 101 - 111 mmol/L   CO2 28 22 - 32 mmol/L   Glucose, Bld 98 65 - 99 mg/dL   BUN 15 6 - 20 mg/dL   Creatinine, Ser 0.64 0.61 - 1.24 mg/dL   Calcium 9.1 8.9 - 10.3 mg/dL   Total Protein 7.3 6.5 - 8.1 g/dL   Albumin 3.8 3.5 - 5.0 g/dL   AST 23 15 - 41 U/L   ALT 59 17 - 63 U/L   Alkaline Phosphatase 56 38 - 126 U/L   Total Bilirubin 0.9 0.3 - 1.2 mg/dL   GFR calc non Af Amer >60 >60 mL/min   GFR calc Af Amer >60 >60 mL/min    Comment: (NOTE) The eGFR has been calculated using the CKD EPI equation. This calculation has not been validated in all clinical  situations. eGFR's persistently <60 mL/min signify possible Chronic Kidney Disease.    Anion gap 6 5 - 15  Protime-INR     Status: None   Collection Time: 01/19/15  3:01 PM  Result Value Ref Range   Prothrombin Time 13.8 11.4 - 15.0 seconds   INR 1.04   CBC WITH DIFFERENTIAL     Status: Abnormal   Collection Time: 01/19/15  3:01 PM  Result Value Ref Range   WBC 19.0 (H) 3.8 - 10.6 K/uL   RBC 4.62 4.40 - 5.90 MIL/uL   Hemoglobin 13.9 13.0 - 18.0 g/dL   HCT 41.8 40.0 - 52.0 %   MCV 90.6 80.0 - 100.0 fL   MCH 30.2 26.0 - 34.0 pg   MCHC 33.3 32.0 - 36.0 g/dL   RDW 13.4 11.5 - 14.5 %   Platelets 227 150 - 440 K/uL   Neutrophils Relative % 87 %   Neutro Abs 16.5 (H) 1.4 - 6.5 K/uL   Lymphocytes Relative 6 %   Lymphs Abs 1.1 1.0 - 3.6 K/uL   Monocytes Relative 7 %   Monocytes Absolute 1.4 (H) 0.2 - 1.0 K/uL   Eosinophils Relative 0 %   Eosinophils Absolute 0.1 0 - 0.7 K/uL   Basophils Relative 0 %   Basophils Absolute 0.0 0 - 0.1 K/uL   No components found for: ESR, C REACTIVE PROTEIN MICRO: Recent Results (from the past 720 hour(s))  Microscopic Examination     Status: Abnormal   Collection Time: 01/19/15 11:30 AM  Result Value Ref Range Status   WBC, UA >30 (H) 0 -  5 /hpf Final   RBC, UA None seen 0 -  2 /hpf Final   Epithelial Cells (non renal) None seen 0 - 10 /hpf Final   Bacteria, UA Many (A) None seen/Few Final    IMAGING: Dg Chest Port 1 View  01/19/2015  CLINICAL DATA:  PICC line placement. EXAM: PORTABLE CHEST 1 VIEW COMPARISON:  None available. FINDINGS: The cardiac silhouette, mediastinal and hilar contours are within normal limits. The lungs are clear. The left PICC line tip is in the distal SVC near the cavoatrial junction. No complicating features. Remote healed right rib fractures are noted. IMPRESSION: Left PICC line tip is in the distal SVC.  No complicating features. No acute cardiopulmonary findings. Electronically Signed   By: Marijo Sanes M.D.   On:  01/19/2015 23:30   Ct Renal Stone Study  01/02/2015  CLINICAL DATA:  Acute onset of difficulty urinating. Lower abdominal pain. Initial encounter. EXAM: CT  ABDOMEN AND PELVIS WITHOUT CONTRAST TECHNIQUE: Multidetector CT imaging of the abdomen and pelvis was performed following the standard protocol without IV contrast. COMPARISON:  None. FINDINGS: Minimal right basilar scarring is noted. The liver and spleen are unremarkable in appearance. The gallbladder is within normal limits. The pancreas and adrenal glands are unremarkable. The kidneys are unremarkable in appearance. There is no evidence of hydronephrosis. No renal or ureteral stones are seen. Minimal nonspecific perinephric stranding is noted bilaterally. No free fluid is identified. The small bowel is unremarkable in appearance. The stomach is within normal limits. No acute vascular abnormalities are seen. Scattered calcification is noted along the abdominal aorta and its branches. The appendix is normal in caliber, without evidence of appendicitis. Scattered diverticulosis is noted about the proximal sigmoid colon, and minimally along the remainder of the colon, without evidence of diverticulitis. The bladder is decompressed, with a Foley catheter in place. The prostate is enlarged, measuring 6.4 cm in transverse dimension, and 7.6 cm in craniocaudal dimension, with mild scattered surrounding calcification. No inguinal lymphadenopathy is seen. No acute osseous abnormalities are identified. Vacuum phenomenon is noted at L5-S1, with underlying facet disease. IMPRESSION: 1. Significantly enlarged prostate, measuring 6.4 cm in transverse dimension, and 7.6 cm in craniocaudal dimension. This may help explain the patient's difficulty urinating. Scattered calcification noted. 2. Foley catheter in place, with decompressed bladder. 3. Scattered calcification along the abdominal aorta and its branches. 4. Scattered diverticulosis along the proximal sigmoid colon,  without evidence of diverticulitis. Electronically Signed   By: Garald Balding M.D.   On: 01/02/2015 01:28   11/7 UCX Urine Culture, Routine - [facility] Final report (A) Result 1 - [facility] Escherichia coli (A) Comment: Greater than 100,000 colony forming units per mL Antimicrobial Susceptibility - [facility] Comment Comment:       ** S = Susceptible; I = Intermediate; R = Resistant **                    P = Positive; N = Negative             MICS are expressed in micrograms per mL    Antibiotic                 RSLT#1    RSLT#2    RSLT#3    RSLT#4 Amoxicillin/Clavulanic Acid    R Ampicillin                     R Cefazolin                      R Cefepime                       R Ceftriaxone                    R Cefuroxime                     R Cephalothin                    R Ciprofloxacin                  R Ertapenem                      S Gentamicin  S Imipenem                       S Levofloxacin                   R Nitrofurantoin                 S Piperacillin                   R Tetracycline                   R Tobramycin                     R Trimethoprim/Sulfa             R  Assessment:   Brandon Brandt is a 69 y.o. male with ESBL UTI, orchitis following foley placement for BPH with urinary retention.   Recommendations Cont meropenem 1 gm q8 hr 14-21 days I rec the longer duration given the complicated nature of the UTI with orchitis.   Will need weekly labs.  I can see in 2 weeks. Potential to stop IV and change to macrobid if markedly improved at that time but I would likely favor the full 21 day course. Thank you very much for allowing me to participate in the care of this patient. Please call with questions.   Cheral Marker. Ola Spurr, MD

## 2015-01-20 NOTE — Care Management (Signed)
Discussed case with Floydene Flock [facility], Dr Ola Spurr and Carlyle Lipa PA. Invanz not on patient insurance formulary. Dr Ola Spurr changed home IVABX to [facility]. Informed Dr Audree Bane office of change.  [facility] would be able to start treatment tomorrow.  Patient Spouse is available to be taught how to assist with IV medications. Patient will get this afternoons dose of Invanz prior to discharge and then start [place] with home health tomorrow.

## 2015-01-20 NOTE — Progress Notes (Signed)
Pt stable. D/c instructions given and education provided. IV removed. [place] set up for IV antix; PICC line in place with drsg clean, dry, and intact. PICC flushed. Pt getting dressed and will be escorted out by staff.

## 2015-01-20 NOTE — Care Management (Signed)
Spoke with PA  Herbert Moors [phone]  from Dr Cherrie Gauze office concerning patient home health orders. Pt will need course of Invanz over the next 2 weeks. Plan is for discharge today after consult with Dr Erlene Quan and ID.   Spoke with patient and spouse concerning discharge plan. Patient will need home antibiotic therapy. PICC line is placed. Patient is due for afternoon dose of med at 1600 today.  Gave patient and spouse copy of Home Health provider list.  [place] due to affiliation with [facility]. Referral placed with Floydene Flock at [facility].

## 2015-01-24 ENCOUNTER — Encounter: Payer: Self-pay | Admitting: Urology

## 2015-01-24 ENCOUNTER — Ambulatory Visit (INDEPENDENT_AMBULATORY_CARE_PROVIDER_SITE_OTHER): Payer: Medicare Other | Admitting: Urology

## 2015-01-24 VITALS — BP 160/70 | HR 76 | Ht 71.0 in | Wt 222.6 lb

## 2015-01-24 DIAGNOSIS — N390 Urinary tract infection, site not specified: Secondary | ICD-10-CM

## 2015-01-24 DIAGNOSIS — E7800 Pure hypercholesterolemia, unspecified: Secondary | ICD-10-CM | POA: Insufficient documentation

## 2015-01-24 DIAGNOSIS — N453 Epididymo-orchitis: Secondary | ICD-10-CM

## 2015-01-24 DIAGNOSIS — R339 Retention of urine, unspecified: Secondary | ICD-10-CM

## 2015-01-24 DIAGNOSIS — N400 Enlarged prostate without lower urinary tract symptoms: Secondary | ICD-10-CM | POA: Diagnosis not present

## 2015-01-24 DIAGNOSIS — I2510 Atherosclerotic heart disease of native coronary artery without angina pectoris: Secondary | ICD-10-CM

## 2015-01-24 DIAGNOSIS — I10 Essential (primary) hypertension: Secondary | ICD-10-CM | POA: Insufficient documentation

## 2015-01-24 HISTORY — DX: Atherosclerotic heart disease of native coronary artery without angina pectoris: I25.10

## 2015-01-24 HISTORY — DX: Essential (primary) hypertension: I10

## 2015-01-24 LAB — URINALYSIS, COMPLETE
Bilirubin, UA: NEGATIVE
Glucose, UA: NEGATIVE
Nitrite, UA: NEGATIVE
Protein, UA: NEGATIVE
SPEC GRAV UA: 1.025 (ref 1.005–1.030)
Urobilinogen, Ur: 0.2 mg/dL (ref 0.2–1.0)
pH, UA: 6.5 (ref 5.0–7.5)

## 2015-01-24 LAB — MICROSCOPIC EXAMINATION
EPITHELIAL CELLS (NON RENAL): NONE SEEN /HPF (ref 0–10)
RBC, UA: NONE SEEN /hpf (ref 0–?)

## 2015-01-24 NOTE — Progress Notes (Signed)
4:33 PM   Brandon Brandt 22-Jan-1946 [id number]  Referring provider: Katheren Shams [address] [place], [place] [address]  Chief Complaint  Patient presents with  . Recurrent UTI    follow up    HPI:  69 year old male admitted last week directly from clinic  With ESBL Escherichia coli UTI , severe right epididymal orchitis. He did have significant leukocytosis , WBC 19.   The PICC line was placed and he was discharged home on meropenem 3 times daily. ID was consulted during that admission who recommended three-week course of IV antibiotics (Dr. Ola Spurr).   He returns today for a wound check. Overall, he is doing much better and started seeing significant improvement in his right hemiscrotum over the past 3 days. He's not had any issues with the PICC site and is able to administer his antibiotic without difficulty. He does have an appointment with Dr. Ola Spurr within the next week.  Previous CT renal stone protocol 01/02/15 noting a significantly enlarged prostate, measuring 6.4 cm in transverse dimension, and 7.6 cm in craniocaudal dimension. No stones or other GU pathology  Baseline urinary symptoms include sensation of incomplete bladder emptying, frequency, intermittency, urgency, weak stream, and nocturia up to 3 times per night.   I-PSS 15 QOL- terrible   PMH: Past Medical History  Diagnosis Date  . Hypertension   . Coronary artery disease   . Pure hypercholesterolemia   . Impaired glucose tolerance   . Shortness of breath dyspnea   . Adenomatous polyps     Surgical History: Past Surgical History  Procedure Laterality Date  . Colonoscopy    . Cardiac catheterization    . Colonoscopy with propofol N/A 01/02/2015    Procedure: COLONOSCOPY WITH PROPOFOL;  Surgeon: Manya Silvas, MD;  Location: [facility] ENDOSCOPY;  Service: Endoscopy;  Laterality: N/A;    Home Medications:    Medication List       This list is accurate as of: 01/24/15  4:33 PM.   Always use your most recent med list.               albuterol 108 (90 BASE) MCG/ACT inhaler  Commonly known as:  PROVENTIL HFA;VENTOLIN HFA  Inhale into the lungs every 6 (six) hours as needed for wheezing or shortness of breath.     ascorbic acid 500 MG tablet  Commonly known as:  VITAMIN C  Take 500 mg by mouth daily.     aspirin 81 MG tablet  Take 81 mg by mouth daily.     ibuprofen 800 MG tablet  Commonly known as:  ADVIL,MOTRIN  Take 1 tablet (800 mg total) by mouth every 8 (eight) hours as needed for mild pain or moderate pain.     lisinopril-hydrochlorothiazide 20-12.5 MG tablet  Commonly known as:  PRINZIDE,ZESTORETIC  Take 1 tablet by mouth daily.     meropenem 1 G injection  Commonly known as:  MERREM     multivitamin tablet  Take 1 tablet by mouth daily.     simvastatin 80 MG tablet  Commonly known as:  ZOCOR  Take 80 mg by mouth daily.     SM OMEGA-3-6-9 FATTY ACIDS PO  Take by mouth.     tamsulosin 0.4 MG Caps capsule  Commonly known as:  FLOMAX  Take 1 capsule (0.4 mg total) by mouth daily.     vitamin E 400 UNIT capsule  Generic drug:  vitamin E  Take 400 Units by mouth daily.  Allergies: No Known Allergies  Family History: Family History  Problem Relation Age of Onset  . Prostate cancer Father     Social History:  reports that he has quit smoking. His smoking use included Cigarettes. He does not have any smokeless tobacco history on file. He reports that he does not drink alcohol or use illicit drugs.  Physical Exam: BP 160/70 mmHg  Pulse 76  Ht 5\' 11"  (1.803 m)  Wt 222 lb 9.6 oz (100.971 kg)  BMI 31.06 kg/m2  Constitutional:  Alert and oriented, No acute distress. HEENT:  AT, moist mucus membranes.  Trachea midline, no masses. Cardiovascular: No clubbing, cyanosis, or edema.   Respiratory: Normal respiratory effort, no increased work of breathing. GU:  Improvement in right enlarged testicle/epididymis , decreased erythema  and edema. No scrotal fluctuance or necrosis. Left testicle normal. Skin: No rashes, bruises or suspicious lesions.   PICC present in the patient's left upper arm, site clean dry and intact. Lymph: No nguinal adenopathy. Neurologic: Grossly intact, no focal deficits, moving all 4 extremities. Psychiatric: Normal mood and affect.  Laboratory Data:   Urinalysis Results for orders placed or performed during the hospital encounter of 01/19/15  Comprehensive metabolic panel  Result Value Ref Range   Sodium 136 135 - 145 mmol/L   Potassium 3.7 3.5 - 5.1 mmol/L   Chloride 102 101 - 111 mmol/L   CO2 28 22 - 32 mmol/L   Glucose, Bld 98 65 - 99 mg/dL   BUN 15 6 - 20 mg/dL   Creatinine, Ser 0.64 0.61 - 1.24 mg/dL   Calcium 9.1 8.9 - 10.3 mg/dL   Total Protein 7.3 6.5 - 8.1 g/dL   Albumin 3.8 3.5 - 5.0 g/dL   AST 23 15 - 41 U/L   ALT 59 17 - 63 U/L   Alkaline Phosphatase 56 38 - 126 U/L   Total Bilirubin 0.9 0.3 - 1.2 mg/dL   GFR calc non Af Amer >60 >60 mL/min   GFR calc Af Amer >60 >60 mL/min   Anion gap 6 5 - 15  Protime-INR  Result Value Ref Range   Prothrombin Time 13.8 11.4 - 15.0 seconds   INR 1.04   CBC WITH DIFFERENTIAL  Result Value Ref Range   WBC 19.0 (H) 3.8 - 10.6 K/uL   RBC 4.62 4.40 - 5.90 MIL/uL   Hemoglobin 13.9 13.0 - 18.0 g/dL   HCT 41.8 40.0 - 52.0 %   MCV 90.6 80.0 - 100.0 fL   MCH 30.2 26.0 - 34.0 pg   MCHC 33.3 32.0 - 36.0 g/dL   RDW 13.4 11.5 - 14.5 %   Platelets 227 150 - 440 K/uL   Neutrophils Relative % 87 %   Neutro Abs 16.5 (H) 1.4 - 6.5 K/uL   Lymphocytes Relative 6 %   Lymphs Abs 1.1 1.0 - 3.6 K/uL   Monocytes Relative 7 %   Monocytes Absolute 1.4 (H) 0.2 - 1.0 K/uL   Eosinophils Relative 0 %   Eosinophils Absolute 0.1 0 - 0.7 K/uL   Basophils Relative 0 %   Basophils Absolute 0.0 0 - 0.1 K/uL     Assessment & Plan:    1. Epididymitis/Orchitis-  Improving on IV meropenem.  Plan for 3 week course as planned.   - Urinalysis,  Complete  2. Urinary Tract Infection- urine culture collected on 01/16/15 positive for pan resistant Escherichia coli. As above.    3. Urinary retention- History of acute urinary retention after drinking  excessive fluids for colonoscopy prep. Voiding well today.  2. BPH- Continue Flomax  F/u 2 weeks to assess PICC removal  Hollice Espy, MD  [facility] [address], [place], [place] [address] [phone]

## 2015-01-26 LAB — CULTURE, URINE COMPREHENSIVE

## 2015-01-30 ENCOUNTER — Other Ambulatory Visit: Payer: Self-pay | Admitting: Urology

## 2015-01-30 DIAGNOSIS — N400 Enlarged prostate without lower urinary tract symptoms: Secondary | ICD-10-CM

## 2015-02-01 DIAGNOSIS — N138 Other obstructive and reflux uropathy: Secondary | ICD-10-CM | POA: Insufficient documentation

## 2015-02-01 DIAGNOSIS — N401 Enlarged prostate with lower urinary tract symptoms: Secondary | ICD-10-CM

## 2015-02-01 DIAGNOSIS — N390 Urinary tract infection, site not specified: Secondary | ICD-10-CM | POA: Insufficient documentation

## 2015-02-01 DIAGNOSIS — Z229 Carrier of infectious disease, unspecified: Secondary | ICD-10-CM | POA: Insufficient documentation

## 2015-02-09 ENCOUNTER — Ambulatory Visit: Payer: Medicare Other | Admitting: Urology

## 2015-02-13 ENCOUNTER — Encounter: Payer: Self-pay | Admitting: Urology

## 2015-02-13 ENCOUNTER — Ambulatory Visit (INDEPENDENT_AMBULATORY_CARE_PROVIDER_SITE_OTHER): Payer: Medicare Other | Admitting: Urology

## 2015-02-13 VITALS — BP 143/69 | HR 82 | Ht 69.5 in | Wt 223.5 lb

## 2015-02-13 DIAGNOSIS — R339 Retention of urine, unspecified: Secondary | ICD-10-CM

## 2015-02-13 DIAGNOSIS — N453 Epididymo-orchitis: Secondary | ICD-10-CM

## 2015-02-13 DIAGNOSIS — N390 Urinary tract infection, site not specified: Secondary | ICD-10-CM

## 2015-02-13 DIAGNOSIS — N400 Enlarged prostate without lower urinary tract symptoms: Secondary | ICD-10-CM | POA: Diagnosis not present

## 2015-02-13 LAB — MICROSCOPIC EXAMINATION: EPITHELIAL CELLS (NON RENAL): NONE SEEN /HPF (ref 0–10)

## 2015-02-13 LAB — URINALYSIS, COMPLETE
Bilirubin, UA: NEGATIVE
Glucose, UA: NEGATIVE
Ketones, UA: NEGATIVE
NITRITE UA: NEGATIVE
PH UA: 7 (ref 5.0–7.5)
Protein, UA: NEGATIVE
RBC, UA: NEGATIVE
Specific Gravity, UA: 1.02 (ref 1.005–1.030)
Urobilinogen, Ur: 0.2 mg/dL (ref 0.2–1.0)

## 2015-02-13 NOTE — Progress Notes (Signed)
2:44 PM  02/13/2015   Brandon Brandt [birth year] [id number]  Referring provider: Katheren Shams [address] [place], [place] [address]  Chief Complaint  Patient presents with  . Follow-up    Epididymitis/Orchitis- Improving on IV meropenem., UTI    HPI:  69 year old male admitted over 3 weeks ago with ESBL Escherichia coli UTI , severe right epididymal orchitis. He did have significant leukocytosis , WBC 19.   The PICC line was placed and he was discharged home on meropenem 3 times daily. ID was consulted during that admission who recommended three-week course of IV antibiotics (Dr. Ola Spurr).  He completed a full three weeks of IV abx on 02/09/15.  He notes significant improvement in his right testicle but does have some dull residual tenderness.  No voiding complains today, no fevers or chills.    Per Dr. Blane Ohara note, recommend repeat UCx after completion of IV abx, then start Macrobid x 2 weeks (started today).  Previous CT renal stone protocol 01/02/15 noting a significantly enlarged prostate, measuring 6.4 cm in transverse dimension, and 7.6 cm in craniocaudal dimension. No stones or other GU pathology  Baseline urinary symptoms include sensation of incomplete bladder emptying, frequency, intermittency, urgency, weak stream, and nocturia up to 3 times per night prior to this episode.    PMH: Past Medical History  Diagnosis Date  . Hypertension   . Coronary artery disease   . Pure hypercholesterolemia   . Impaired glucose tolerance   . Shortness of breath dyspnea   . Adenomatous polyps     Surgical History: Past Surgical History  Procedure Laterality Date  . Colonoscopy    . Cardiac catheterization    . Colonoscopy with propofol N/A 01/02/2015    Procedure: COLONOSCOPY WITH PROPOFOL;  Surgeon: Manya Silvas, MD;  Location: [facility] ENDOSCOPY;  Service: Endoscopy;  Laterality: N/A;    Home Medications:    Medication List       This list is  accurate as of: 02/13/15  2:44 PM.  Always use your most recent med list.               albuterol 108 (90 BASE) MCG/ACT inhaler  Commonly known as:  PROVENTIL HFA;VENTOLIN HFA  Inhale into the lungs every 6 (six) hours as needed for wheezing or shortness of breath.     ascorbic acid 500 MG tablet  Commonly known as:  VITAMIN C  Take 500 mg by mouth daily.     aspirin 81 MG tablet  Take 81 mg by mouth daily.     ibuprofen 800 MG tablet  Commonly known as:  ADVIL,MOTRIN  Take 1 tablet (800 mg total) by mouth every 8 (eight) hours as needed for mild pain or moderate pain.     lisinopril-hydrochlorothiazide 20-12.5 MG tablet  Commonly known as:  PRINZIDE,ZESTORETIC  Take 1 tablet by mouth daily.     meropenem 1 G injection  Commonly known as:  MERREM     multivitamin tablet  Take 1 tablet by mouth daily.     nitrofurantoin (macrocrystal-monohydrate) 100 MG capsule  Commonly known as:  MACROBID  Take by mouth.     simvastatin 80 MG tablet  Commonly known as:  ZOCOR  Take 80 mg by mouth daily.     SM OMEGA-3-6-9 FATTY ACIDS PO  Take by mouth.     sodium chloride 0.9 % infusion     tamsulosin 0.4 MG Caps capsule  Commonly known as:  FLOMAX  TAKE ONE CAPSULE BY  MOUTH DAILY     vitamin E 400 UNIT capsule  Generic drug:  vitamin E  Take 400 Units by mouth daily.        Allergies: No Known Allergies  Family History: Family History  Problem Relation Age of Onset  . Prostate cancer Father     Social History:  reports that he has quit smoking. His smoking use included Cigarettes. He does not have any smokeless tobacco history on file. He reports that he does not drink alcohol or use illicit drugs.  Physical Exam: BP 143/69 mmHg  Pulse 82  Ht 5' 9.5" (1.765 m)  Wt 223 lb 8 oz (101.379 kg)  BMI 32.54 kg/m2  Constitutional:  Alert and oriented, No acute distress. HEENT: [place] AT, moist mucus membranes.  Trachea midline, no masses. Cardiovascular: No clubbing,  cyanosis, or edema.   Respiratory: Normal respiratory effort, no increased work of breathing. GU:  Dramatically improve in right enlarged testicle/epididymis, remains mildly enlarged.  No erythema and edema of the scrotal skin. No scrotal fluctuance or necrosis. Left testicle normal. Skin: No rashes, bruises or suspicious lesions.   PICC present in the patient's left upper arm, site clean dry and intact. Lymph: No nguinal adenopathy. Neurologic: Grossly intact, no focal deficits, moving all 4 extremities. Psychiatric: Normal mood and affect.  Laboratory Data:   Urinalysis UA today with trace LE, otherwise dip negative.  Micro negative for WBC/ RBC, + mucus, few bacteria.  Assessment & Plan:    1. Epididymitis/Orchitis-  Signficantly improved right epididymo-orchitis but mild residual right testicular enlargement.  No further abx IV recommended given significant improvement.  Order for PICC line removal sent to his home health.  Plan for Macrobid 100 mg bid x 14 days per ID consult recommendations.      - Urinalysis, Complete -UCx  2. Urinary Tract Infection- Urine culture collected on 01/16/15 positive for pan resistant Escherichia coli. Repeated today.     3. Urinary retention- History of acute urinary retention after drinking excessive fluids for colonoscopy prep. Voiding well today.   2. BPH- Continue Flomax.  Reassess voiding symptoms at next visit.  F/u scheduled in 3-4 weeks to repeat exam/    Hollice Espy, MD  [facility] [address], [place], [place] [address] [phone]

## 2015-02-15 LAB — CULTURE, URINE COMPREHENSIVE

## 2015-03-08 ENCOUNTER — Encounter: Payer: Self-pay | Admitting: Obstetrics and Gynecology

## 2015-03-08 ENCOUNTER — Ambulatory Visit (INDEPENDENT_AMBULATORY_CARE_PROVIDER_SITE_OTHER): Payer: Medicare Other | Admitting: Obstetrics and Gynecology

## 2015-03-08 VITALS — BP 145/70 | HR 84 | Ht 71.0 in | Wt 231.4 lb

## 2015-03-08 DIAGNOSIS — N138 Other obstructive and reflux uropathy: Secondary | ICD-10-CM

## 2015-03-08 DIAGNOSIS — N453 Epididymo-orchitis: Secondary | ICD-10-CM

## 2015-03-08 DIAGNOSIS — N401 Enlarged prostate with lower urinary tract symptoms: Secondary | ICD-10-CM

## 2015-03-08 DIAGNOSIS — N390 Urinary tract infection, site not specified: Secondary | ICD-10-CM | POA: Diagnosis not present

## 2015-03-08 LAB — URINALYSIS, COMPLETE
Bilirubin, UA: NEGATIVE
GLUCOSE, UA: NEGATIVE
KETONES UA: NEGATIVE
Nitrite, UA: NEGATIVE
Protein, UA: NEGATIVE
RBC, UA: NEGATIVE
SPEC GRAV UA: 1.015 (ref 1.005–1.030)
Urobilinogen, Ur: 0.2 mg/dL (ref 0.2–1.0)
pH, UA: 7.5 (ref 5.0–7.5)

## 2015-03-08 LAB — MICROSCOPIC EXAMINATION
BACTERIA UA: NONE SEEN
Epithelial Cells (non renal): NONE SEEN /hpf (ref 0–10)
RBC MICROSCOPIC, UA: NONE SEEN /HPF (ref 0–?)

## 2015-03-08 LAB — BLADDER SCAN AMB NON-IMAGING: Scan Result: 146

## 2015-03-08 NOTE — Progress Notes (Signed)
Bladder Scan Patient void: 15ml Performed By: Larna Daughters

## 2015-03-08 NOTE — Progress Notes (Signed)
2:40 PM  03/08/2015   Brandon Brandt 1946/02/21 [id number]  Referring provider: Katheren Shams [address] [place], [place] [address]  Chief Complaint  Patient presents with  . Follow-up    BPH, recurrent UTI    HPI:  69 year old male admitted over 3 weeks ago with ESBL Escherichia coli UTI , severe right epididymal orchitis. He did have significant leukocytosis , WBC 19.   The PICC line was placed and he was discharged home on meropenem 3 times daily. ID was consulted during that admission who recommended three-week course of IV antibiotics (Dr. Ola Spurr).  He completed a full three weeks of IV abx on 02/09/15.  He notes significant improvement in his right testicle but does have some dull residual tenderness.  No voiding complains today, no fevers or chills.    Per Dr. Blane Ohara note, recommend repeat UCx after completion of IV abx, then start Macrobid x 2 weeks (started today).  Previous CT renal stone protocol 01/02/15 noting a significantly enlarged prostate, measuring 6.4 cm in transverse dimension, and 7.6 cm in craniocaudal dimension. No stones or other GU pathology  Baseline urinary symptoms include sensation of incomplete bladder emptying, frequency, intermittency, urgency, weak stream, and nocturia up to 3 times per night prior to this episode.    Current Status: Patient has completed oral antibiotics.  He is currently taking Flomax for LUTS with significant improvement in urinary symptoms.  His I-PSS score is 2 with 2 QOL- pleased.  He reports continued improvement in right hemiscrotum.  He still feels some firmness but no residual discomfort. No fevers or chills.    PMH: Past Medical History  Diagnosis Date  . Hypertension   . Coronary artery disease   . Pure hypercholesterolemia   . Impaired glucose tolerance   . Shortness of breath dyspnea   . Adenomatous polyps   . Benign hypertension 01/24/2015  . CAD in native artery 01/24/2015     Surgical History: Past Surgical History  Procedure Laterality Date  . Colonoscopy    . Cardiac catheterization    . Colonoscopy with propofol N/A 01/02/2015    Procedure: COLONOSCOPY WITH PROPOFOL;  Surgeon: Manya Silvas, MD;  Location: [facility] ENDOSCOPY;  Service: Endoscopy;  Laterality: N/A;    Home Medications:    Medication List       This list is accurate as of: 03/08/15  2:40 PM.  Always use your most recent med list.               albuterol 108 (90 Base) MCG/ACT inhaler  Commonly known as:  PROVENTIL HFA;VENTOLIN HFA  Inhale into the lungs every 6 (six) hours as needed for wheezing or shortness of breath.     ascorbic acid 500 MG tablet  Commonly known as:  VITAMIN C  Take 500 mg by mouth daily.     aspirin 81 MG tablet  Take 81 mg by mouth daily.     ibuprofen 800 MG tablet  Commonly known as:  ADVIL,MOTRIN  Take 1 tablet (800 mg total) by mouth every 8 (eight) hours as needed for mild pain or moderate pain.     lisinopril-hydrochlorothiazide 20-12.5 MG tablet  Commonly known as:  PRINZIDE,ZESTORETIC  Take 1 tablet by mouth daily.     multivitamin tablet  Take 1 tablet by mouth daily.     simvastatin 80 MG tablet  Commonly known as:  ZOCOR  Take 80 mg by mouth daily.     SM OMEGA-3-6-9 FATTY ACIDS PO  Take by mouth.     tamsulosin 0.4 MG Caps capsule  Commonly known as:  FLOMAX  TAKE ONE CAPSULE BY MOUTH DAILY     vitamin E 400 UNIT capsule  Generic drug:  vitamin E  Take 400 Units by mouth daily.        Allergies: No Known Allergies  Family History: Family History  Problem Relation Age of Onset  . Prostate cancer Father     Social History:  reports that he has quit smoking. His smoking use included Cigarettes. He does not have any smokeless tobacco history on file. He reports that he does not drink alcohol or use illicit drugs.  Physical Exam: BP 145/70 mmHg  Pulse 84  Ht 5\' 11"  (1.803 m)  Wt 231 lb 6.4 oz (104.962 kg)  BMI  32.29 kg/m2  Constitutional:  Alert and oriented, No acute distress. HEENT: [place] AT, moist mucus membranes.  Trachea midline, no masses. Cardiovascular: No clubbing, cyanosis, or edema.   Respiratory: Normal respiratory effort, no increased work of breathing. GU:  minimally enlarged right testicle/epididymis,  No erythema or edema of the scrotal skin. No scrotal fluctuance or necrosis. Left testicle normal. DRE: prostate enlarged +3, smooth and nontender, no nodules Skin: No rashes, bruises or suspicious lesions.   PICC present in the patient's left upper arm, site clean dry and intact. Lymph: No nguinal adenopathy. Neurologic: Grossly intact, no focal deficits, moving all 4 extremities. Psychiatric: Normal mood and affect.  Laboratory Data:   Urinalysis UA today with trace LE, otherwise dip negative.  Micro negative for RBC/bacteria, 0-5 WBCs + mucus  Assessment & Plan:    1. Epididymitis/Orchitis-  Signficantly improved right epididymo-orchitis but very mild residual right testicular enlargement.  Completed IV and oral antibiotics.  - Urinalysis, Complete -UCx  2. Urinary Tract Infection- Urine culture collected on 01/16/15 positive for pan resistant Escherichia coli. Culture last visit was negative.  UA unremarkable today.   3. Urinary retention- History of acute urinary retention after drinking excessive fluids for colonoscopy prep. Voiding well today.   2. BPH with LUTS- PSA drawn today. Patient reports minimal urinary symptoms. He is well managed on once daily Flomax.I-PSS: 2. His PVR was slightly elevated at 143mL today. We will continue the Flomax for now. Follow up in 6 months for recheck    Herbert Moors, [facility] [address] [place], [place] [address] [phone]

## 2015-03-09 LAB — PSA TOTAL (REFLEX TO FREE): Prostate Specific Ag, Serum: 9 ng/mL — ABNORMAL HIGH (ref 0.0–4.0)

## 2015-03-09 LAB — FPSA% REFLEX
% FREE PSA: 27.7 %
PSA, FREE: 2.49 ng/mL

## 2015-03-10 ENCOUNTER — Telehealth: Payer: Self-pay

## 2015-03-10 DIAGNOSIS — R972 Elevated prostate specific antigen [PSA]: Secondary | ICD-10-CM

## 2015-03-10 NOTE — Telephone Encounter (Signed)
Spoke with pt and made aware of PSA results. Pt voiced understanding. Pt was transferred to the front to make f/u appt.

## 2015-03-10 NOTE — Telephone Encounter (Signed)
-----   Message from Roda Shutters, [place] sent at 03/10/2015  8:50 AM EST ----- Please notify patient that his PSA level was elevated. I would like him to come back in for a lab draw to rule out a possible lab error or transient elevation. I would like him to have this drawn and then return for a visit with me to discuss the results within the next few weeks.  Thanks

## 2015-03-10 NOTE — Telephone Encounter (Signed)
LMOM

## 2015-03-15 ENCOUNTER — Other Ambulatory Visit: Payer: Self-pay | Admitting: Urology

## 2015-03-15 ENCOUNTER — Other Ambulatory Visit: Payer: Medicare Other

## 2015-03-15 DIAGNOSIS — R972 Elevated prostate specific antigen [PSA]: Secondary | ICD-10-CM

## 2015-03-16 LAB — PSA: Prostate Specific Ag, Serum: 8.2 ng/mL — ABNORMAL HIGH (ref 0.0–4.0)

## 2015-03-22 ENCOUNTER — Ambulatory Visit (INDEPENDENT_AMBULATORY_CARE_PROVIDER_SITE_OTHER): Payer: Medicare Other | Admitting: Obstetrics and Gynecology

## 2015-03-22 ENCOUNTER — Encounter: Payer: Self-pay | Admitting: Obstetrics and Gynecology

## 2015-03-22 VITALS — BP 166/76 | HR 80 | Resp 16 | Wt 233.9 lb

## 2015-03-22 DIAGNOSIS — N400 Enlarged prostate without lower urinary tract symptoms: Secondary | ICD-10-CM | POA: Diagnosis not present

## 2015-03-22 DIAGNOSIS — R972 Elevated prostate specific antigen [PSA]: Secondary | ICD-10-CM

## 2015-03-22 NOTE — Patient Instructions (Signed)
  Transrectal Prostate Biopsy Patient Education and Post Procedure Instructions    -Definition A prostate biopsy is the removal of a small amount of tissue from the prostate gland. The tissue is examined to determine whether there is cancer.  -Reasons for Procedure A prostate biopsy is usually done after an abnormal finding by: Digital rectal exam Prostate specific antigen (PSA) blood test A prostate biopsy is the only way to find out if cancer cells are present.  -Possible Complications Problems from the procedure are rare, but all procedures have some risk including: Infection Bruising or lengthy bleeding from the rectum, or in urine or semen Difficulty urinating Reactions to anesthesia Factors that may increase the risk of complications include: Smoking History of bleeding disorders or easy bruising Use of any medications, over-the-counter medications, or herbal supplements Sensitivity or allergy to latex, medications, or anesthesia.  -Prior to Procedure Talk to your doctor about your medications. Blood thinning medications including aspirin should be stopped 1 week prior to procedure. If prescribed by your cardiologist we may need approval before stopping medications. Use a Fleets enema 2 hours before the procedure. Can be purchased at your pharmacy. Antibiotics will be administered in the clinic prior to procedure.  Please make sure you eat a light meal prior to coming in for your appointment. This can help prevent lightheadedness during the procedure and upset stomach from antibiotics. Please bring someone with you to the procedure to drive you home.  -Anesthesia Transrectal biopsy: Local anesthesia--Just the area that is being operated on is numbed using an injectable anesthetic.  -Description of the Procedure Transrectal biopsy--Your doctor will insert a small ultrasound device into the rectum. This device will produce sound waves to create an image of the prostate.  These images will help guide placement of the needle. Your doctor will then insert the needle through the wall of the rectum and into the prostate gland. The procedure should take approximately 15-30 minutes.  -Will It Hurt? You may have discomfort and soreness at the biopsy site. Pain and discomfort after the procedure can be managed with medications.  -Postoperative Care When you return home after the procedure, do the following to help ensure a smooth recovery: Stay hydrated. Drink plenty of fluids for the next few days. Avoid difficult physical activity the day and evening of the procedure. Keep in mind that you may see blood in your urine, stool, or semen for several days. Resume any medications that were stopped when you are advised to do so.  After the sample is taken, it will be sent to a pathologist for examination under a microscope. This doctor will analyze the sample for cancer. You will be scheduled for an appointment to discuss results. If cancer is present, your doctor will work with you to develop a treatment plan.   -Call Your Doctor or Seek Immediate Medical Attention It is important to monitor your recovery. Alert your doctor to any problems. If any of the following occur, call your doctor or go to the emergency room: Fever 100.5 or greater within 1 week post procedure go directly to ER Call the office for: Blood in the urine more than 1 week or in semen for more than 6 weeks post-biopsy Pain that you cannot control with the medications you have been given Pain, burning, urgency, or frequency of urination Cough, shortness of breath, or chest pain- if severe go to ER Heavy rectal bleeding or bleeding that lasts more than 1 week after the biopsy If you   have any questions or concerns please contact our office at [facility]  [place] [address], [place], [place] [address] [phone]     Transrectal Ultrasound-Guided  Biopsy A transrectal ultrasound-guided biopsy is a procedure to remove samples of tissue from your prostate using ultrasound images to guide the procedure. The procedure is usually done to evaluate the prostate gland of men who have an elevated prostate-specific antigen (PSA). PSA is a blood test to screen for prostate cancer. The biopsy samples are taken to check for prostate cancer.  LET [facility] CARE PROVIDER KNOW ABOUT:  Any allergies you have.  All medicines you are taking, including vitamins, herbs, eye drops, creams, and over-the-counter medicines.  Previous problems you or members of your family have had with the use of anesthetics.  Any blood disorders you have.  Previous surgeries you have had.  Medical conditions you have. RISKS AND COMPLICATIONS Generally, this is a safe procedure. However, as with any procedure, problems can occur. Possible problems include:  Infection of your prostate.  Bleeding from your rectum or blood in your urine.  Difficulty urinating.  Nerve damage (this is usually temporary).  Damage to surrounding structures such as blood vessels, organs, and muscles, which would require other procedures. BEFORE THE PROCEDURE  Do not eat or drink anything after midnight on the night before the procedure or as directed by your health care provider.  Take medicines only as directed by your health care provider.  Your health care provider may have you stop taking certain medicines 5-7 days before the procedure.  You will be given an enema before the procedure. During an enema, a liquid is injected into your rectum to clear out waste.  You may have lab tests the day of your procedure.   Plan to have someone take you home after the procedure. PROCEDURE   You will be given medicine to help you relax (sedative) before the procedure. An IV tube will be inserted into one of your veins and used to give fluids and medicine.  You will be given antibiotic  medicine to reduce the risk of an infection.  You will be placed on your side for the procedure.  A probe with lubricated gel will be placed into your rectum, and images will be taken of your prostate and surrounding structures.  Numbing medicine will be injected into the prostate before the biopsy samples are taken.  A biopsy needle will then be inserted and guided to your prostate with the use of the ultrasound images.  Samples of prostate tissue will be taken, and the needle will then be removed.  The biopsy samples will be sent to a lab to be analyzed. Results are usually back in 2-3 days. AFTER THE PROCEDURE  You will be taken to a recovery area where you will be monitored.  You may have some discomfort in the rectal area. You will be given pain medicines to control this.  You may be allowed to go home the same day, or you may need to stay in the hospital overnight.   This information is not intended to replace advice given to you by your health care provider. Make sure you discuss any questions you have with your health care provider.   Document Released: 07/12/2013 Document Revised: 03/18/2014 Document Reviewed: 07/12/2013 Elsevier Interactive Patient Education [employer].

## 2015-03-22 NOTE — Progress Notes (Signed)
4:12 PM  03/22/2015   Brandon Brandt 03-08-1946 [id number]  Referring provider: Katheren Shams [address] [place], [place] [address]  Chief Complaint  Patient presents with  . Benign Prostatic Hypertrophy    HPI: Patient is a 70 year old male with a history of BPH and epididymal orchitis presenting for discussion of his recent elevated PSA results.  PSA History: 03/15/15     PSA 8.2 03/08/15 PSA 9.0  Previous CT renal stone protocol 01/02/15 noting a significantly enlarged prostate, measuring 6.4 cm in transverse dimension, and 7.6 cm in craniocaudal dimension. No stones or other GU pathology  Baseline urinary symptoms include sensation of incomplete bladder emptying, frequency, intermittency, urgency, weak stream, and nocturia up to 3 times per night prior to this episode.    He is currently taking Flomax for LUTS with significant improvement in urinary symptoms.  His I-PSS score is 2 with 2 QOL- pleased.  He reports continued improvement in right hemiscrotum.  He still feels some firmness but no residual discomfort. No fevers or chills.    PMH: Past Medical History  Diagnosis Date  . Hypertension   . Coronary artery disease   . Pure hypercholesterolemia   . Impaired glucose tolerance   . Shortness of breath dyspnea   . Adenomatous polyps   . Benign hypertension 01/24/2015  . CAD in native artery 01/24/2015    Surgical History: Past Surgical History  Procedure Laterality Date  . Colonoscopy    . Cardiac catheterization    . Colonoscopy with propofol N/A 01/02/2015    Procedure: COLONOSCOPY WITH PROPOFOL;  Surgeon: Manya Silvas, MD;  Location: [facility] ENDOSCOPY;  Service: Endoscopy;  Laterality: N/A;    Home Medications:    Medication List       This list is accurate as of: 03/22/15  4:12 PM.  Always use your most recent med list.               albuterol 108 (90 Base) MCG/ACT inhaler  Commonly known as:  PROVENTIL HFA;VENTOLIN HFA  Inhale  into the lungs every 6 (six) hours as needed for wheezing or shortness of breath.     ascorbic acid 500 MG tablet  Commonly known as:  VITAMIN C  Take 500 mg by mouth daily.     aspirin 81 MG tablet  Take 81 mg by mouth daily.     ibuprofen 800 MG tablet  Commonly known as:  ADVIL,MOTRIN  Take 1 tablet (800 mg total) by mouth every 8 (eight) hours as needed for mild pain or moderate pain.     lisinopril-hydrochlorothiazide 20-12.5 MG tablet  Commonly known as:  PRINZIDE,ZESTORETIC  Take 1 tablet by mouth daily.     multivitamin tablet  Take 1 tablet by mouth daily.     simvastatin 80 MG tablet  Commonly known as:  ZOCOR  Take 80 mg by mouth daily.     SM OMEGA-3-6-9 FATTY ACIDS PO  Take by mouth.     tamsulosin 0.4 MG Caps capsule  Commonly known as:  FLOMAX  TAKE ONE CAPSULE BY MOUTH DAILY     vitamin E 400 UNIT capsule  Generic drug:  vitamin E  Take 400 Units by mouth daily.        Allergies: No Known Allergies  Family History: Family History  Problem Relation Age of Onset  . Prostate cancer Father     Social History:  reports that he has quit smoking. His smoking use included Cigarettes. He does not  have any smokeless tobacco history on file. He reports that he does not drink alcohol or use illicit drugs.  Physical Exam: BP 166/76 mmHg  Pulse 80  Resp 16  Wt 233 lb 14.4 oz (106.096 kg)  Constitutional:  Alert and oriented, No acute distress. HEENT: [place] AT, moist mucus membranes.  Trachea midline, no masses. Cardiovascular: No clubbing, cyanosis, or edema.   Respiratory: Normal respiratory effort, no increased work of breathing. Skin: No rashes, bruises or suspicious lesions.   PICC present in the patient's left upper arm, site clean dry and intact. Lymph: No nguinal adenopathy. Neurologic: Grossly intact, no focal deficits, moving all 4 extremities. Psychiatric: Normal mood and affect.  Laboratory Data: Results for orders placed or performed in visit  on 03/15/15  PSA  Result Value Ref Range   Prostate Specific Ag, Serum 8.2 (H) 0.0 - 4.0 ng/mL     Urinalysis   Assessment & Plan:    1. Elevated PSA-  We reviewed the implications of an elevated PSA and the uncertainty surrounding it. In general, a man's PSA increases with age and is produced by both normal and cancerous prostate tissue. Differential for elevated PSA is BPH, prostate cancer, infection, recent intercourse/ejaculation, prostate infarction, recent urethroscopic manipulation (foley placement/cystoscopy) and prostatitis. Management of an elevated PSA can include observation or prostate biopsy and we discussed this in detail.  We discussed that indications for prostate biopsy are defined by age and race specific PSA cutoffs as well as a PSA velocity of 0.75/year.  We discussed prostate biopsy in detail including the procedure itself, the risks of blood in the urine, stool, and ejaculate, serious infection, and discomfort. He is willing to proceed with this as discussed.   2. BPH with LUTS- Patient reports minimal urinary symptoms. He is well managed on once daily Flomax.I-PSS: 2. His PVR was slightly elevated at 139mL today. DRE: prostate enlarged without nodules We will continue the Flomax for now. Follow up in 6 months for recheck    Herbert Moors, [facility] [address], [place], [place] [address] [phone]

## 2015-04-03 DIAGNOSIS — Z85828 Personal history of other malignant neoplasm of skin: Secondary | ICD-10-CM | POA: Insufficient documentation

## 2015-04-12 ENCOUNTER — Encounter: Payer: Self-pay | Admitting: Urology

## 2015-04-12 ENCOUNTER — Ambulatory Visit (INDEPENDENT_AMBULATORY_CARE_PROVIDER_SITE_OTHER): Payer: Medicare Other | Admitting: Urology

## 2015-04-12 VITALS — BP 145/75 | HR 71 | Ht 71.0 in | Wt 228.9 lb

## 2015-04-12 DIAGNOSIS — R972 Elevated prostate specific antigen [PSA]: Secondary | ICD-10-CM | POA: Diagnosis not present

## 2015-04-12 DIAGNOSIS — N400 Enlarged prostate without lower urinary tract symptoms: Secondary | ICD-10-CM

## 2015-04-12 MED ORDER — GENTAMICIN SULFATE 40 MG/ML IJ SOLN
80.0000 mg | Freq: Once | INTRAMUSCULAR | Status: AC
  Administered 2015-04-12: 80 mg via INTRAMUSCULAR

## 2015-04-12 MED ORDER — LEVOFLOXACIN 500 MG PO TABS
500.0000 mg | ORAL_TABLET | Freq: Once | ORAL | Status: AC
  Administered 2015-04-12: 500 mg via ORAL

## 2015-04-12 NOTE — Progress Notes (Signed)
3:54 PM  04/12/2015   Brandon Brandt 1946-02-08 [id number]  Referring provider: Katheren Shams [address] [place], [place] [address]  Chief Complaint  Patient presents with  . Biopsy    HPI:  70 year old male with a history of BPH, elevated PSA and history of epididymal orchitis who presents today for prostate biopsy for history of elevated PSA.  No previous biopsies.    PSA History: 03/15/15     PSA 8.2 03/08/15 PSA 9.0  Previous CT renal stone protocol 01/02/15 noting a significantly enlarged prostate, measuring 6.4 cm in transverse dimension, and 7.6 cm in craniocaudal dimension. No stones or other GU pathology  Baseline urinary symptoms include sensation of incomplete bladder emptying, frequency, intermittency, urgency, weak stream, and nocturia up to 3 times per night prior to this episode.    He is currently taking Flomax for LUTS with significant improvement in urinary symptoms.  His I-PSS score is 2 with 2 QOL- pleased.    PMH: Past Medical History  Diagnosis Date  . Hypertension   . Coronary artery disease   . Pure hypercholesterolemia   . Impaired glucose tolerance   . Shortness of breath dyspnea   . Adenomatous polyps   . Benign hypertension 01/24/2015  . CAD in native artery 01/24/2015    Surgical History: Past Surgical History  Procedure Laterality Date  . Colonoscopy    . Cardiac catheterization    . Colonoscopy with propofol N/A 01/02/2015    Procedure: COLONOSCOPY WITH PROPOFOL;  Surgeon: Manya Silvas, MD;  Location[facility] ENDOSCOPY;  Service: Endoscopy;  Laterality: N/A;    Home Medications:    Medication List       This list is accurate as of: 04/12/15  3:54 PM.  Always use your most recent med list.               albuterol 108 (90 Base) MCG/ACT inhaler  Commonly known as:  PROVENTIL HFA;VENTOLIN HFA  Inhale into the lungs every 6 (six) hours as needed for wheezing or shortness of breath.     ascorbic acid 500 MG  tablet  Commonly known as:  VITAMIN C  Take 500 mg by mouth daily.     aspirin 81 MG tablet  Take 81 mg by mouth daily. Reported on 04/12/2015     ibuprofen 800 MG tablet  Commonly known as:  ADVIL,MOTRIN  Take 1 tablet (800 mg total) by mouth every 8 (eight) hours as needed for mild pain or moderate pain.     lisinopril-hydrochlorothiazide 20-12.5 MG tablet  Commonly known as:  PRINZIDE,ZESTORETIC  Take 1 tablet by mouth daily.     multivitamin tablet  Take 1 tablet by mouth daily.     simvastatin 80 MG tablet  Commonly known as:  ZOCOR  Take 80 mg by mouth daily.     SM OMEGA-3-6-9 FATTY ACIDS PO  Take by mouth.     tamsulosin 0.4 MG Caps capsule  Commonly known as:  FLOMAX  TAKE ONE CAPSULE BY MOUTH DAILY     vitamin E 400 UNIT capsule  Generic drug:  vitamin E  Take 400 Units by mouth daily.        Allergies: No Known Allergies  Family History: Family History  Problem Relation Age of Onset  . Prostate cancer Father     Social History:  reports that he has quit smoking. His smoking use included Cigarettes. He does not have any smokeless tobacco history on file. He reports that he  does not drink alcohol or use illicit drugs.  Physical Exam: BP 145/75 mmHg  Pulse 71  Ht 5\' 11"  (1.803 m)  Wt 228 lb 14.4 oz (103.828 kg)  BMI 31.94 kg/m2  Constitutional:  Alert and oriented, No acute distress. HEENT: [place] AT, moist mucus membranes.  Trachea midline, no masses. Cardiovascular: No clubbing, cyanosis, or edema.   Respiratory: Normal respiratory effort, no increased work of breathing. Skin: No rashes, bruises or suspicious lesions.   Rectal exam: Normal sphincter tone, significantly enlarged gland 50+ cc, unable to palpate base due to size of gland and patient habitus.  No palpable nodules or tenderness. Neurologic: Grossly intact, no focal deficits, moving all 4 extremities. Psychiatric: Normal mood and affect.  Laboratory Data: Results for orders placed or  performed in visit on 03/15/15  PSA  Result Value Ref Range   Prostate Specific Ag, Serum 8.2 (H) 0.0 - 4.0 ng/mL    Prostate Biopsy Procedure   Informed consent was obtained after discussing risks/benefits of the procedure.  A time out was performed to ensure correct patient identity.  Pre-Procedure: - Last PSA Level: 8.2 - Gentamicin given prophylactically - Levaquin 500 mg administered PO -Transrectal Ultrasound performed revealing a 177 gm prostate -No significant hypoechoic or median lobe noted  Procedure: - Prostate block performed using 10 cc 1% lidocaine and biopsies taken from sextant areas, a total of 12 under ultrasound guidance.  Post-Procedure: - Patient tolerated the procedure well - He was counseled to seek immediate medical attention if experiences any severe pain, significant bleeding, or fevers - Return in one week to discuss biopsy results  Assessment & Plan:    1. Elevated PSA-  S/p uncomplicated biopsy Warning symptoms reviewed today 177 g prostate, PSA likely related to volume  2. BPH with LUTS- Patient reports minimal urinary symptoms. He is well managed on once daily Flomax.I-PSS: 2. His PVR was slightly elevated at 158mL today. We will continue the Flomax for now.    RTC in 1-2 weeks to discuss prostate biopsy results  Hollice Espy, MD  [facility] [address], [place], [place] [address] [phone]

## 2015-04-18 LAB — PATHOLOGY REPORT

## 2015-04-19 ENCOUNTER — Ambulatory Visit (INDEPENDENT_AMBULATORY_CARE_PROVIDER_SITE_OTHER): Payer: Medicare Other | Admitting: Urology

## 2015-04-19 VITALS — BP 138/74 | HR 85 | Ht 71.0 in | Wt 228.0 lb

## 2015-04-19 DIAGNOSIS — N400 Enlarged prostate without lower urinary tract symptoms: Secondary | ICD-10-CM | POA: Diagnosis not present

## 2015-04-19 DIAGNOSIS — R339 Retention of urine, unspecified: Secondary | ICD-10-CM | POA: Diagnosis not present

## 2015-04-19 DIAGNOSIS — R972 Elevated prostate specific antigen [PSA]: Secondary | ICD-10-CM

## 2015-04-19 MED ORDER — FINASTERIDE 5 MG PO TABS: 5.0000 mg | ORAL_TABLET | Freq: Every day | ORAL | Status: DC

## 2015-04-20 ENCOUNTER — Encounter: Payer: Self-pay | Admitting: Urology

## 2015-04-23 ENCOUNTER — Encounter: Payer: Self-pay | Admitting: Urology

## 2015-04-23 NOTE — Progress Notes (Signed)
1:08 PM  04/19/15  Brandon Brandt 04-05-1945 [id number]  Referring provider: Katheren Shams [address] [name], [place] [address]   HPI:  70 year old male with a history of BPH, elevated PSA and history of epididymal orchitis who presents with his wife following prostate biopsy to discuss results.    Biopsy NEGATIVE for malignancy, acute and chronic inflammation noted.  TRUS vol 177 cc.  PSA History: 03/15/15     PSA 8.2 03/08/15 PSA 9.0  Previous CT renal stone protocol 01/02/15 noting a significantly enlarged prostate, measuring 6.4 cm in transverse dimension, and 7.6 cm in craniocaudal dimension. No stones or other GU pathology  Baseline urinary symptoms include sensation of incomplete bladder emptying, frequency, intermittency, urgency, weak stream, and nocturia up to 3 times per night prior to this episode.    He is currently taking Flomax for LUTS with significant improvement in urinary symptoms.  His I-PSS score is 2 with 2 QOL- pleased.    PMH: Past Medical History  Diagnosis Date  . Hypertension   . Coronary artery disease   . Pure hypercholesterolemia   . Impaired glucose tolerance   . Shortness of breath dyspnea   . Adenomatous polyps   . Benign hypertension 01/24/2015  . CAD in native artery 01/24/2015    Surgical History: Past Surgical History  Procedure Laterality Date  . Colonoscopy    . Cardiac catheterization    . Colonoscopy with propofol N/A 01/02/2015    Procedure: COLONOSCOPY WITH PROPOFOL;  Surgeon: Manya Silvas, MD;  Location: [facility] ENDOSCOPY;  Service: Endoscopy;  Laterality: N/A;    Home Medications:    Medication List       This list is accurate as of: 04/19/15 11:59 PM.  Always use your most recent med list.               albuterol 108 (90 Base) MCG/ACT inhaler  Commonly known as:  PROVENTIL HFA;VENTOLIN HFA  Inhale into the lungs every 6 (six) hours as needed for wheezing or shortness of breath.     ascorbic acid  500 MG tablet  Commonly known as:  VITAMIN C  Take 500 mg by mouth daily.     aspirin 81 MG tablet  Take 81 mg by mouth daily. Reported on 04/12/2015     finasteride 5 MG tablet  Commonly known as:  PROSCAR  Take 1 tablet (5 mg total) by mouth daily.     ibuprofen 800 MG tablet  Commonly known as:  ADVIL,MOTRIN  Take 1 tablet (800 mg total) by mouth every 8 (eight) hours as needed for mild pain or moderate pain.     lisinopril-hydrochlorothiazide 20-12.5 MG tablet  Commonly known as:  PRINZIDE,ZESTORETIC  Take 1 tablet by mouth daily.     multivitamin tablet  Take 1 tablet by mouth daily.     simvastatin 80 MG tablet  Commonly known as:  ZOCOR  Take 80 mg by mouth daily.     SM OMEGA-3-6-9 FATTY ACIDS PO  Take by mouth.     tamsulosin 0.4 MG Caps capsule  Commonly known as:  FLOMAX  TAKE ONE CAPSULE BY MOUTH DAILY     vitamin E 400 UNIT capsule  Generic drug:  vitamin E  Take 400 Units by mouth daily.        Allergies: No Known Allergies  Family History: Family History  Problem Relation Age of Onset  . Prostate cancer Father     Social History:  reports that he  has quit smoking. His smoking use included Cigarettes. He does not have any smokeless tobacco history on file. He reports that he does not drink alcohol or use illicit drugs.  Physical Exam: BP 138/74 mmHg  Pulse 85  Ht 5\' 11"  (1.803 m)  Wt 228 lb (103.42 kg)  BMI 31.81 kg/m2  Constitutional:  Alert and oriented, No acute distress.  Presents with wife today HEENT: [place] AT, moist mucus membranes.  Trachea midline, no masses. Cardiovascular: No clubbing, cyanosis, or edema.   Respiratory: Normal respiratory effort, no increased work of breathing. Skin: No rashes, bruises or suspicious lesions.   Neurologic: Grossly intact, no focal deficits, moving all 4 extremities. Psychiatric: Normal mood and affect.  Laboratory Data: Results for orders placed or performed in visit on 03/15/15  PSA  Result Value  Ref Range   Prostate Specific Ag, Serum 8.2 (H) 0.0 - 4.0 ng/mL    Assessment & Plan:    1. Elevated PSA-  S/p uncomplicated biopsy which was negative for malignancy- pathology reviewed today 177 g prostate, PSA likely related to volume  2. BPH with LUTS As below   3. Incomplete bladder emptying History of elevated PVR 100-200 cc/ UTI/ history of urinary retention Continue Flomax Start finasteride, discussed common side effects  Return in about 6 months (around 10/17/2015) for PSA/ DRE, IPSS, PVR .  Hollice Espy, MD  [facility] [address], [place], [place] [address] [phone]

## 2015-04-24 ENCOUNTER — Other Ambulatory Visit: Payer: Self-pay | Admitting: Urology

## 2015-04-24 DIAGNOSIS — N400 Enlarged prostate without lower urinary tract symptoms: Secondary | ICD-10-CM

## 2015-09-06 ENCOUNTER — Ambulatory Visit: Payer: Medicare Other | Admitting: Obstetrics and Gynecology

## 2015-09-24 ENCOUNTER — Other Ambulatory Visit: Payer: Self-pay | Admitting: Urology

## 2015-09-24 DIAGNOSIS — N400 Enlarged prostate without lower urinary tract symptoms: Secondary | ICD-10-CM

## 2015-10-18 ENCOUNTER — Encounter: Payer: Self-pay | Admitting: Urology

## 2015-10-18 ENCOUNTER — Ambulatory Visit (INDEPENDENT_AMBULATORY_CARE_PROVIDER_SITE_OTHER): Payer: Medicare Other | Admitting: Urology

## 2015-10-18 VITALS — BP 143/75 | HR 71 | Ht 71.0 in | Wt 229.3 lb

## 2015-10-18 DIAGNOSIS — N401 Enlarged prostate with lower urinary tract symptoms: Secondary | ICD-10-CM

## 2015-10-18 DIAGNOSIS — N400 Enlarged prostate without lower urinary tract symptoms: Secondary | ICD-10-CM

## 2015-10-18 DIAGNOSIS — R972 Elevated prostate specific antigen [PSA]: Secondary | ICD-10-CM | POA: Diagnosis not present

## 2015-10-18 DIAGNOSIS — R339 Retention of urine, unspecified: Secondary | ICD-10-CM | POA: Diagnosis not present

## 2015-10-18 DIAGNOSIS — N138 Other obstructive and reflux uropathy: Secondary | ICD-10-CM

## 2015-10-18 LAB — BLADDER SCAN AMB NON-IMAGING: SCAN RESULT: 152

## 2015-10-18 MED ORDER — TAMSULOSIN HCL 0.4 MG PO CAPS: 0.4000 mg | ORAL_CAPSULE | Freq: Every day | ORAL | 3 refills | Status: DC

## 2015-10-18 MED ORDER — FINASTERIDE 5 MG PO TABS: 5.0000 mg | ORAL_TABLET | Freq: Every day | ORAL | 3 refills | Status: DC

## 2015-10-18 NOTE — Progress Notes (Signed)
9:08 AM  04/19/15  Brandon Brandt 08/15/1945 [id number]  Referring provider: Katheren Shams [address] [place], [place] [address]   HPI:  70 year old male with a history of BPH, elevated PSA and history of epididymoorchitis   Elevated PSA/ B Biopsy NEGATIVE for malignancy 2/17, acute and chronic inflammation noted.  TRUS vol 177 cc.  PSA History: 03/15/15     PSA 8.2 03/08/15 PSA 9.0  Previous CT renal stone protocol 01/02/15 noting a significantly enlarged prostate, measuring 6.4 cm in transverse dimension, and 7.6 cm in craniocaudal dimension. No stones or other GU pathology.  BPH with LUTS Previous CT renal stone protocol 01/02/15 noting a significantly enlarged prostate, measuring 6.4 cm in transverse dimension, and 7.6 cm in craniocaudal dimension. No stones or other GU pathology  He is currently taking Flomax and finasteride for LUTS .  Since starting this combination of medications, he no longer gets up at night to void and no longer strains.  IPSS 4/1.  No complaints today, happy with voiding symptoms .   No UTIs or scrotal infections since last visit.        IPSS    Row Name 10/18/15 0800         International Prostate Symptom Score   How often have you had the sensation of not emptying your bladder? Not at All     How often have you had to urinate less than every two hours? Less than 1 in 5 times     How often have you found you stopped and started again several times when you urinated? Not at All     How often have you found it difficult to postpone urination? Not at All     How often have you had a weak urinary stream? Less than 1 in 5 times     How often have you had to strain to start urination? Not at All     How many times did you typically get up at night to urinate? 2 Times     Total IPSS Score 4       Quality of Life due to urinary symptoms   If you were to spend the rest of your life with your urinary condition just the way it is now how  would you feel about that? Pleased        Score:  1-7 Mild 8-19 Moderate 20-35 Severe   PMH: Past Medical History:  Diagnosis Date  . Adenomatous polyps   . Benign hypertension 01/24/2015  . CAD in native artery 01/24/2015  . Coronary artery disease   . Hypertension   . Impaired glucose tolerance   . Pure hypercholesterolemia   . Shortness of breath dyspnea     Surgical History: Past Surgical History:  Procedure Laterality Date  . CARDIAC CATHETERIZATION    . COLONOSCOPY    . COLONOSCOPY WITH PROPOFOL N/A 01/02/2015   Procedure: COLONOSCOPY WITH PROPOFOL;  Surgeon: Manya Silvas, MD;  Location: [facility] ENDOSCOPY;  Service: Endoscopy;  Laterality: N/A;    Home Medications:    Medication List       Accurate as of 10/18/15  9:08 AM. Always use your most recent med list.          albuterol 108 (90 Base) MCG/ACT inhaler Commonly known as:  PROVENTIL HFA;VENTOLIN HFA Inhale into the lungs every 6 (six) hours as needed for wheezing or shortness of breath.   ascorbic acid 500 MG tablet Commonly known as:  VITAMIN C  Take 500 mg by mouth daily.   aspirin 81 MG tablet Take 81 mg by mouth daily. Reported on 04/12/2015   finasteride 5 MG tablet Commonly known as:  PROSCAR Take 1 tablet (5 mg total) by mouth daily.   ibuprofen 800 MG tablet Commonly known as:  ADVIL,MOTRIN Take 1 tablet (800 mg total) by mouth every 8 (eight) hours as needed for mild pain or moderate pain.   lisinopril-hydrochlorothiazide 20-12.5 MG tablet Commonly known as:  PRINZIDE,ZESTORETIC Take 1 tablet by mouth daily.   multivitamin tablet Take 1 tablet by mouth daily.   simvastatin 80 MG tablet Commonly known as:  ZOCOR Take 80 mg by mouth daily.   SM OMEGA-3-6-9 FATTY ACIDS PO Take by mouth.   tamsulosin 0.4 MG Caps capsule Commonly known as:  FLOMAX Take 1 capsule (0.4 mg total) by mouth daily.   vitamin E 400 UNIT capsule Generic drug:  vitamin E Take 400 Units by mouth  daily.       Allergies: No Known Allergies  Family History: Family History  Problem Relation Age of Onset  . Prostate cancer Father     Social History:  reports that he has quit smoking. His smoking use included Cigarettes. He has never used smokeless tobacco. He reports that he does not drink alcohol or use drugs.  Physical Exam: BP (!) 143/75 (BP Location: Left Arm, Patient Position: Sitting, Cuff Size: Large)   Pulse 71   Ht 5\' 11"  (1.803 m)   Wt 229 lb 4.8 oz (104 kg)   BMI 31.98 kg/m   Constitutional:  Alert and oriented, No acute distress.  Presents with wife today HEENT: [place] AT, moist mucus membranes.  Trachea midline, no masses. Cardiovascular: No clubbing, cyanosis, or edema.   Respiratory: Normal respiratory effort, no increased work of breathing. Skin: No rashes, bruises or suspicious lesions.   Neurologic: Grossly intact, no focal deficits, moving all 4 extremities. Psychiatric: Normal mood and affect.  Laboratory Data: Results for orders placed or performed in visit on 10/18/15  Bladder Scan (Post Void Residual) in office  Result Value Ref Range   Scan Result 152     Assessment & Plan:    1. Elevated PSA-  Recheck PSA today, anticipate PSA drop since starting finasteride If stable, then recheck in 1 year If did not fall, consider prostate MRI- discussed with patient  177 g prostate, PSA likely related to volume  2. BPH with LUTS As below   3. Incomplete bladder emptying History of elevated PVR 100-200 cc/ UTI/ history of urinary retention Asymptomatic Continue Flomax/ finasteride (started 03/2015)  Return in about 1 year (around 10/17/2016) for PSA/ DRE/ IPSS/ PVR.  Hollice Espy, MD  [facility] [address] [place], [place] [address] [phone]

## 2015-10-19 LAB — PSA: Prostate Specific Ag, Serum: 3.5 ng/mL (ref 0.0–4.0)

## 2015-10-20 ENCOUNTER — Telehealth: Payer: Self-pay

## 2015-10-20 NOTE — Telephone Encounter (Signed)
-----   Message from Hollice Espy, MD sent at 10/19/2015 10:08 AM EDT ----- PSA dropped by about 1/2 (actually a little more) as expected.  Excellent news!  See you next year.    Hollice Espy, MD

## 2015-10-20 NOTE — Telephone Encounter (Signed)
Spoke with pt wife in reference to PSA results. Wife voiced understanding.  

## 2016-07-27 IMAGING — CT CT RENAL STONE PROTOCOL
3 of 4 series · 9 of 46 positions shown, 16 images · non-contrast
Comparison: None.

CLINICAL DATA: Acute onset of difficulty urinating. Lower abdominal
pain. Initial encounter.

EXAM:
CT ABDOMEN AND PELVIS WITHOUT CONTRAST
TECHNIQUE: Multidetector CT imaging of the abdomen and pelvis was performed
following the standard protocol without IV contrast.

[Series 4: lung · axial · 0.86mm/px · z∈[-241,-151]mm · 5 of 28 slices shown, 10 images]
[im 5/28  soft-tissue]
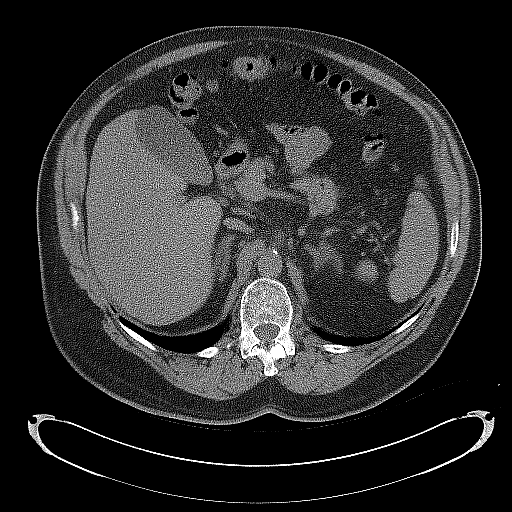
[im 5/28  bone]
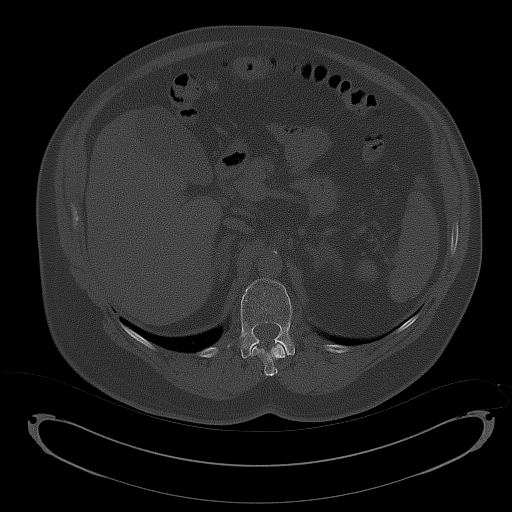
[im 10/28  soft-tissue]
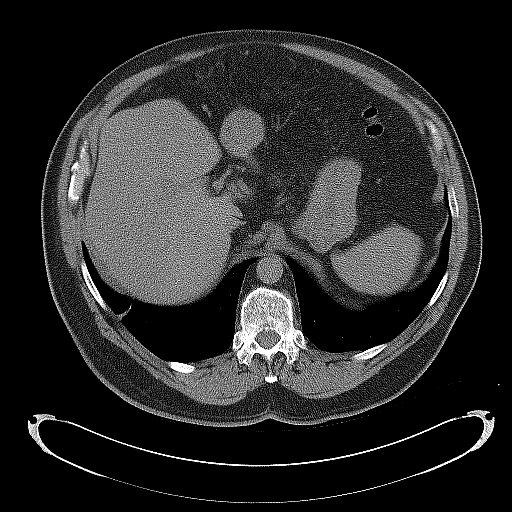
[im 10/28  lung]
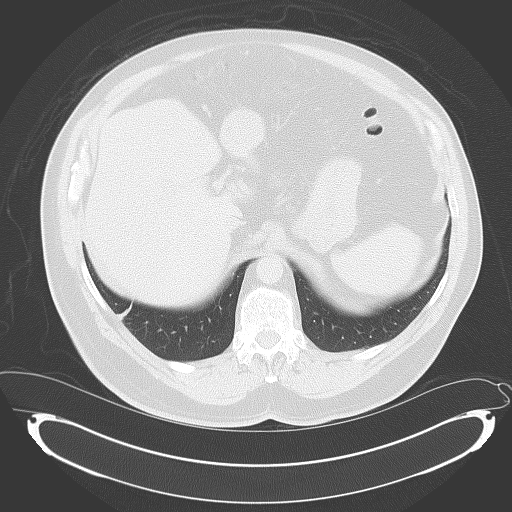
[im 14/28  soft-tissue]
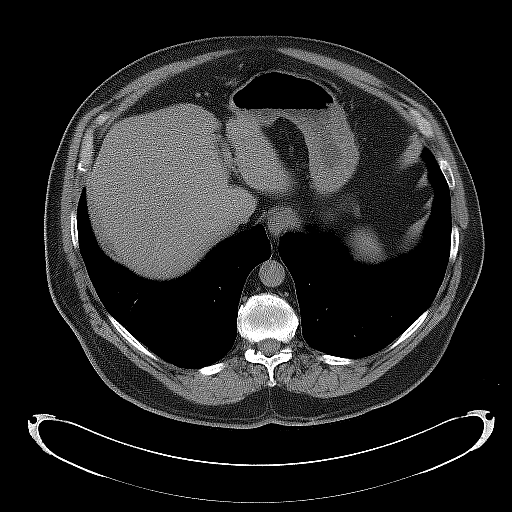
[im 14/28  lung]
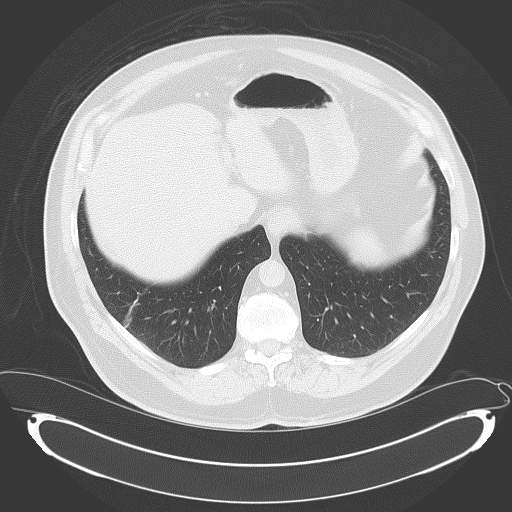
[im 19/28  soft-tissue]
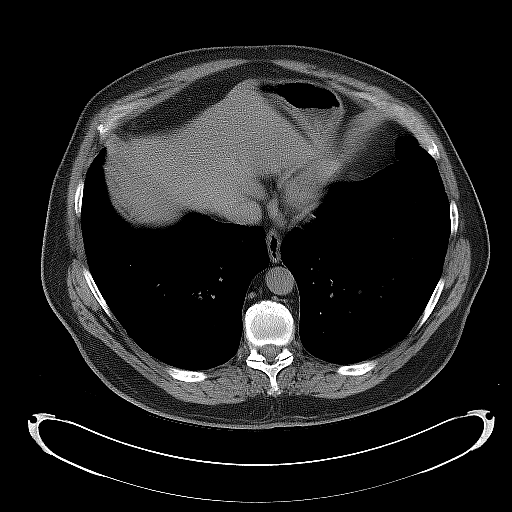
[im 19/28  lung]
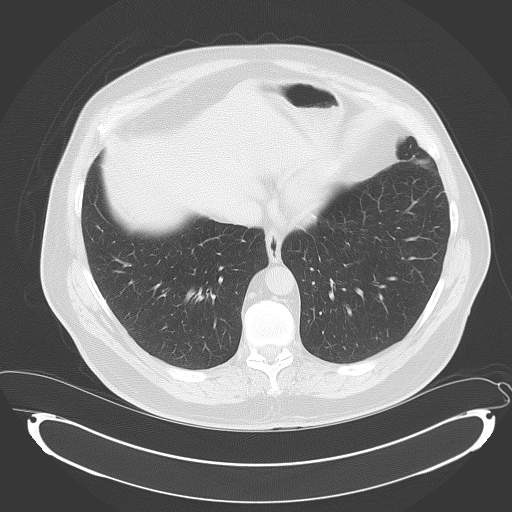
[im 23/28  soft-tissue]
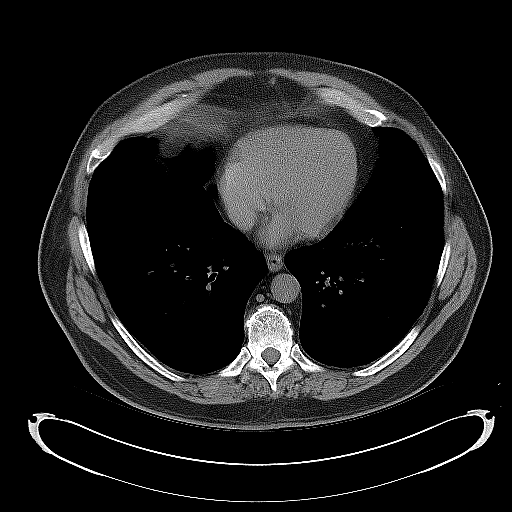
[im 23/28  lung]
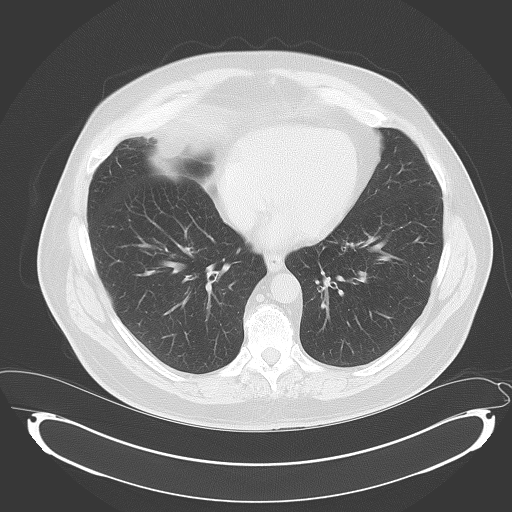

[Series 5: coronal · coronal · 0.81mm/px · 3 of 168 slices shown, 4 images]
[im 56/168  soft-tissue]
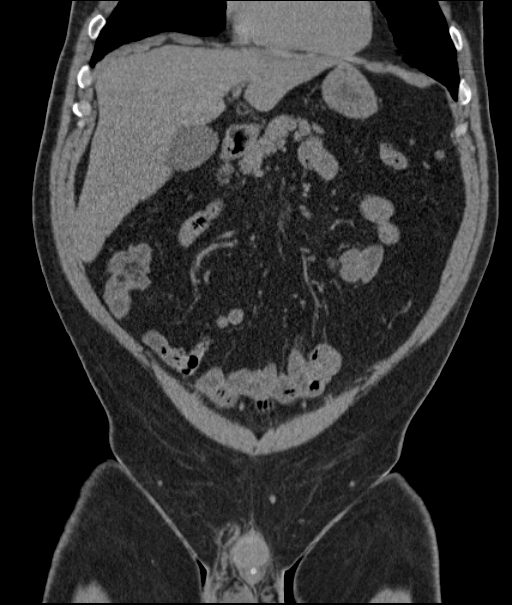
[im 75/168  soft-tissue]
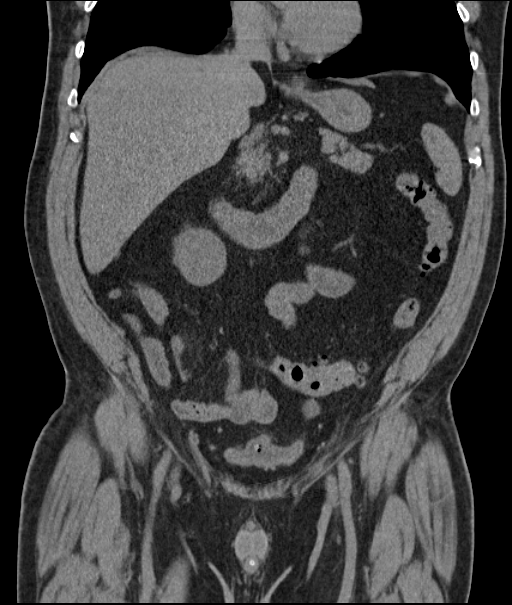
[im 75/168  bone]
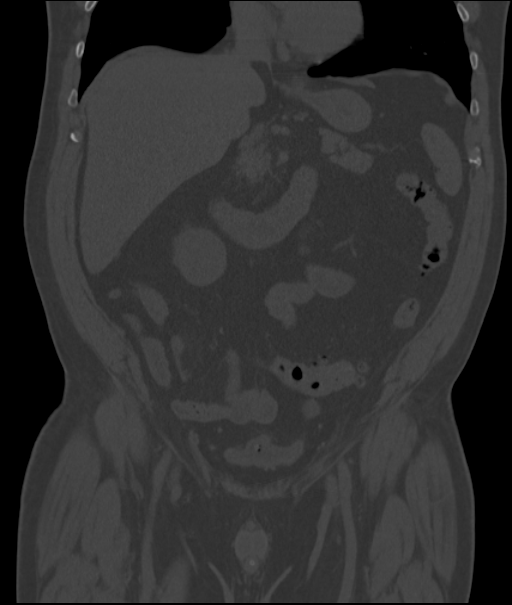
[im 93/168  soft-tissue]
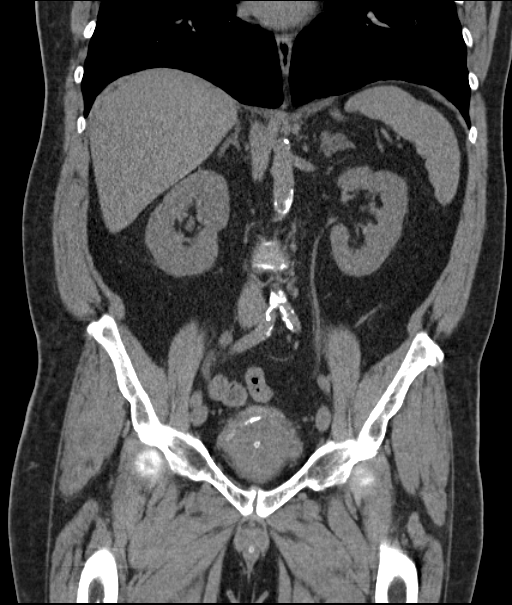

[Series 6: sagittal · sagittal · 0.72mm/px · 1 of 198 slices shown, 2 images]
[im 66/198  soft-tissue]
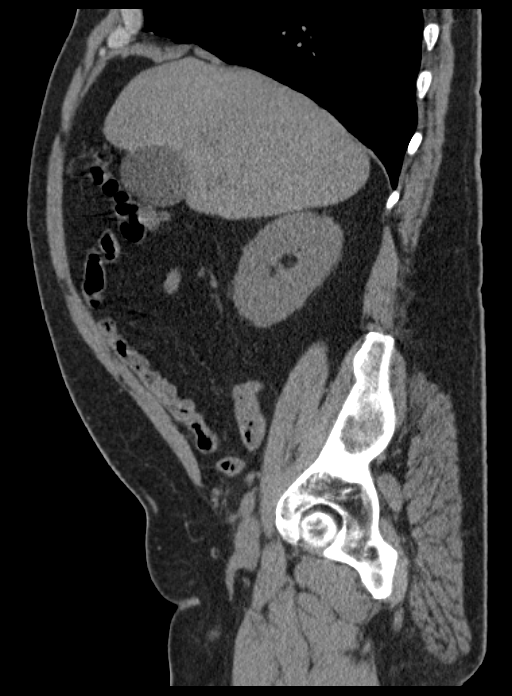
[im 66/198  bone]
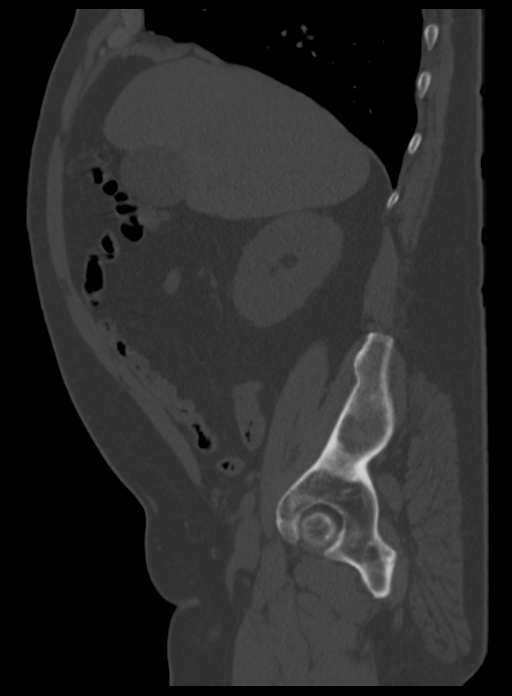

[9 of 46 positions shown; findings below may reference images not displayed]

FINDINGS: Minimal right basilar scarring is noted.

The liver and spleen are unremarkable in appearance. The gallbladder
is within normal limits. The pancreas and adrenal glands are
unremarkable.

The kidneys are unremarkable in appearance. There is no evidence of
hydronephrosis. No renal or ureteral stones are seen. Minimal
nonspecific perinephric stranding is noted bilaterally.

No free fluid is identified. The small bowel is unremarkable in
appearance. The stomach is within normal limits. No acute vascular
abnormalities are seen. Scattered calcification is noted along the
abdominal aorta and its branches.

The appendix is normal in caliber, without evidence of appendicitis.
Scattered diverticulosis is noted about the proximal sigmoid colon,
and minimally along the remainder of the colon, without evidence of
diverticulitis.

The bladder is decompressed, with a Foley catheter in place. The
prostate is enlarged, measuring 6.4 cm in transverse dimension, and
7.6 cm in craniocaudal dimension, with mild scattered surrounding
calcification. No inguinal lymphadenopathy is seen.

No acute osseous abnormalities are identified. Vacuum phenomenon is
noted at L5-S1, with underlying facet disease.
IMPRESSION: 1. Significantly enlarged prostate, measuring 6.4 cm in transverse
dimension, and 7.6 cm in craniocaudal dimension. This may help
explain the patient's difficulty urinating. Scattered calcification
noted.
2. Foley catheter in place, with decompressed bladder.
3. Scattered calcification along the abdominal aorta and its
branches.
4. Scattered diverticulosis along the proximal sigmoid colon,
without evidence of diverticulitis.

## 2016-08-13 IMAGING — CR DG CHEST 1V PORT
1 series · 1 of 1 positions shown · non-contrast
Comparison: None available.

CLINICAL DATA: PICC line placement.

EXAM:
PORTABLE CHEST 1 VIEW

[ap]
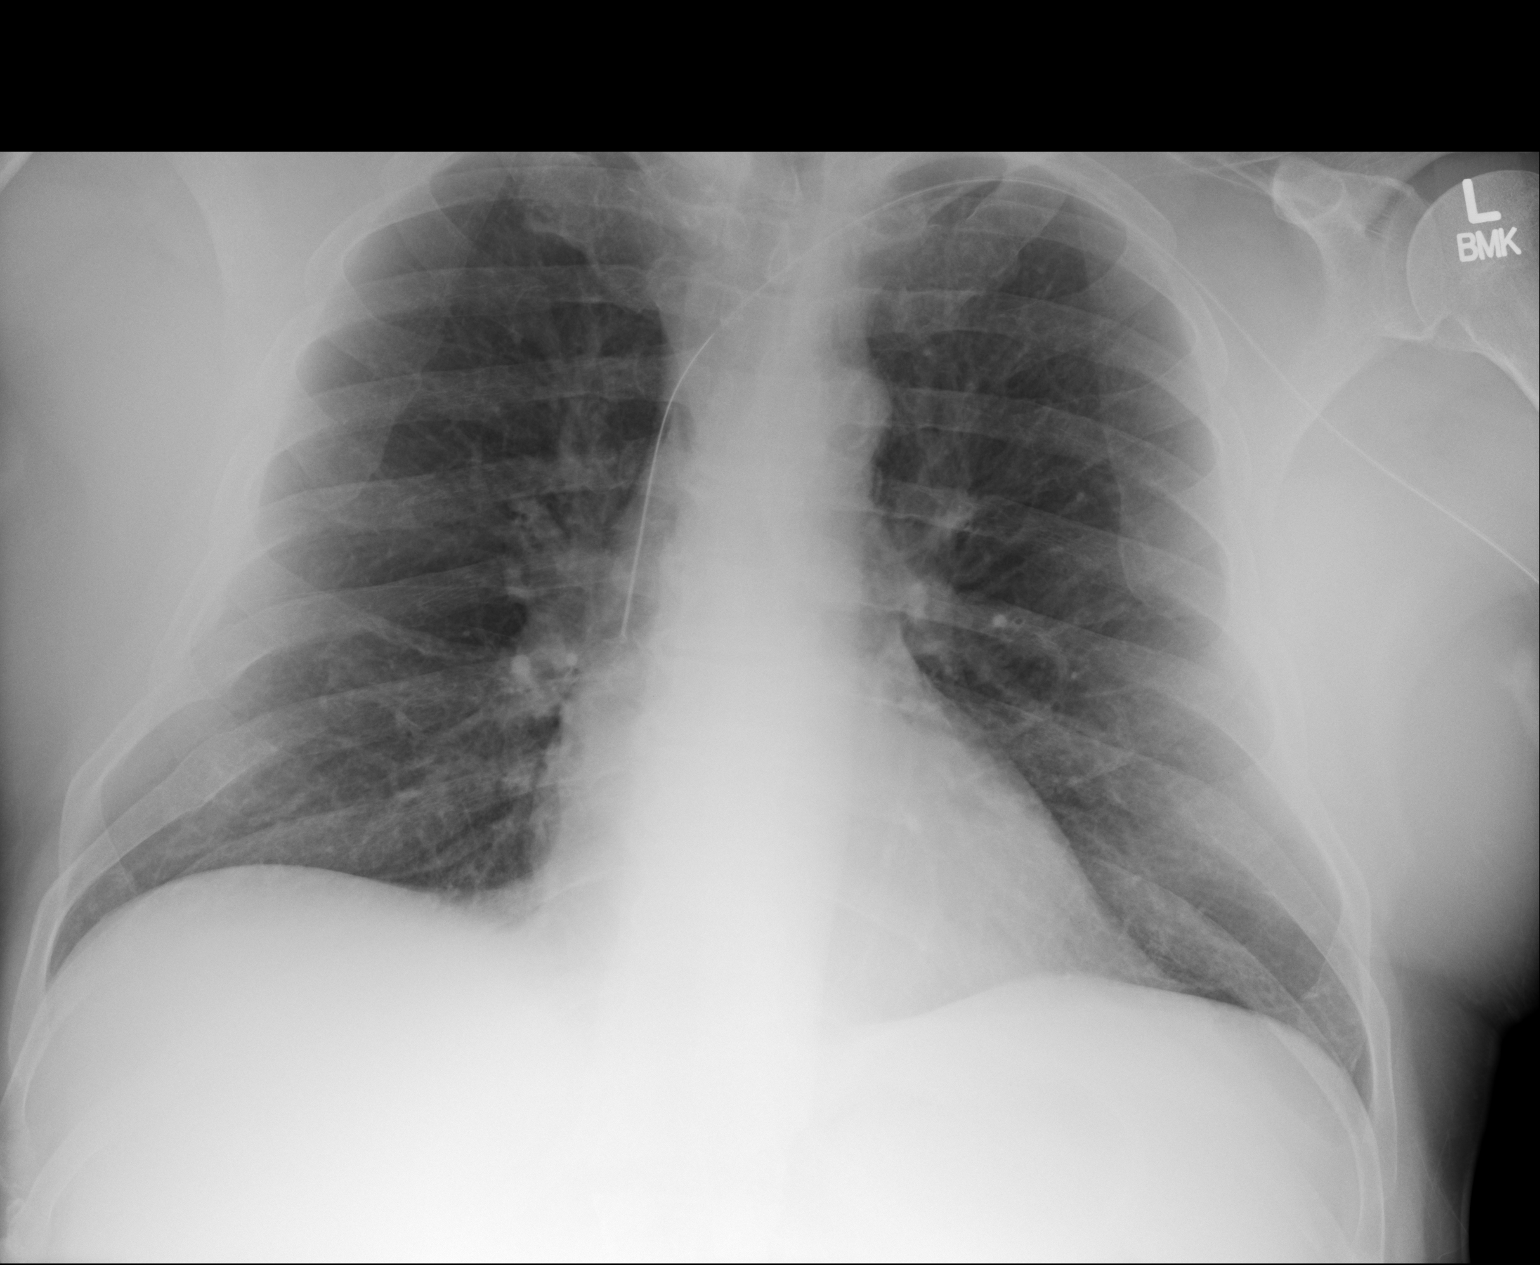

[1 of 1 positions shown; findings below may reference images not displayed]

FINDINGS: The cardiac silhouette, mediastinal and hilar contours are within
normal limits. The lungs are clear. The left PICC line tip is in the
distal SVC near the cavoatrial junction. No complicating features.
Remote healed right rib fractures are noted.
IMPRESSION: Left PICC line tip is in the distal SVC.  No complicating features.

No acute cardiopulmonary findings.

## 2016-10-16 ENCOUNTER — Ambulatory Visit: Payer: Medicare Other | Admitting: Urology

## 2016-10-24 ENCOUNTER — Other Ambulatory Visit: Payer: Self-pay

## 2016-10-24 DIAGNOSIS — R972 Elevated prostate specific antigen [PSA]: Secondary | ICD-10-CM

## 2016-10-25 ENCOUNTER — Ambulatory Visit (INDEPENDENT_AMBULATORY_CARE_PROVIDER_SITE_OTHER): Payer: Medicare Other | Admitting: Urology

## 2016-10-25 ENCOUNTER — Other Ambulatory Visit
Admission: RE | Admit: 2016-10-25 | Discharge: 2016-10-25 | Disposition: A | Discharge status: Home / Self Care | Payer: Medicare Other | Source: Ambulatory Visit | Attending: Urology | Admitting: Urology

## 2016-10-25 ENCOUNTER — Encounter: Payer: Self-pay | Admitting: Urology

## 2016-10-25 VITALS — BP 144/105 | HR 80 | Ht 68.0 in | Wt 230.0 lb

## 2016-10-25 DIAGNOSIS — R972 Elevated prostate specific antigen [PSA]: Secondary | ICD-10-CM

## 2016-10-25 DIAGNOSIS — N400 Enlarged prostate without lower urinary tract symptoms: Secondary | ICD-10-CM

## 2016-10-25 LAB — BLADDER SCAN AMB NON-IMAGING

## 2016-10-25 NOTE — Progress Notes (Signed)
3:14 PM  04/19/15  Brandon Brandt 05/06/1945 [id number]  Referring provider: Katheren Shams [place], [place] [address]   HPI:  71 year old male with a history of BPH, elevated PSA and history of epididymoorchitis.  Elevated PSA Biopsy NEGATIVE for malignancy 2/17, acute and chronic inflammation noted.  TRUS vol 177 cc.  PSA History: PSA today pending 10/18/15  PSA 3.5 03/15/15     PSA 8.2  --> started on finasteride  03/08/15 PSA 9.0  Previous CT renal stone protocol 01/02/15 noting a significantly enlarged prostate, measuring 6.4 cm in transverse dimension, and 7.6 cm in craniocaudal dimension. No stones or other GU pathology.  BPH with LUTS Previous CT renal stone protocol 01/02/15 noting a significantly enlarged prostate, measuring 6.4 cm in transverse dimension, and 7.6 cm in craniocaudal dimension. No stones or other GU pathology  He is currently taking Flomax and finasteride for LUTS .  IPSS 9/1.  No complaints today, happy with voiding symptoms . Nocturia x 2.  Minimal bother.    No UTIs or scrotal infections since last visit.  No dysuria or gross hematuria.    PVR 105cc.        IPSS    Row Name 10/25/16 1500         International Prostate Symptom Score   How often have you had the sensation of not emptying your bladder? Almost always     How often have you had to urinate less than every two hours? Less than 1 in 5 times     How often have you found you stopped and started again several times when you urinated? Not at All     How often have you found it difficult to postpone urination? Less than 1 in 5 times     How often have you had a weak urinary stream? Not at All     How often have you had to strain to start urination? Not at All     How many times did you typically get up at night to urinate? 2 Times     Total IPSS Score 9       Quality of Life due to urinary symptoms   If you were to spend the rest of your life with your urinary  condition just the way it is now how would you feel about that? Pleased        Score:  1-7 Mild 8-19 Moderate 20-35 Severe   PMH: Past Medical History:  Diagnosis Date  . Adenomatous polyps   . Benign hypertension 01/24/2015  . CAD in native artery 01/24/2015  . Coronary artery disease   . Hypertension   . Impaired glucose tolerance   . Pure hypercholesterolemia   . Shortness of breath dyspnea     Surgical History: Past Surgical History:  Procedure Laterality Date  . CARDIAC CATHETERIZATION    . COLONOSCOPY    . COLONOSCOPY WITH PROPOFOL N/A 01/02/2015   Procedure: COLONOSCOPY WITH PROPOFOL;  Surgeon: Manya Silvas, MD;  Location: [facility] ENDOSCOPY;  Service: Endoscopy;  Laterality: N/A;    Home Medications:  Allergies as of 10/25/2016   No Known Allergies     Medication List       Accurate as of 10/25/16  3:14 PM. Always use your most recent med list.          albuterol 108 (90 Base) MCG/ACT inhaler Commonly known as:  PROVENTIL HFA;VENTOLIN HFA Inhale into the lungs every 6 (six) hours as needed  for wheezing or shortness of breath.   ascorbic acid 500 MG tablet Commonly known as:  VITAMIN C Take 500 mg by mouth daily.   aspirin 81 MG tablet Take 81 mg by mouth daily. Reported on 04/12/2015   finasteride 5 MG tablet Commonly known as:  PROSCAR Take 1 tablet (5 mg total) by mouth daily.   ibuprofen 800 MG tablet Commonly known as:  ADVIL,MOTRIN Take 1 tablet (800 mg total) by mouth every 8 (eight) hours as needed for mild pain or moderate pain.   lisinopril-hydrochlorothiazide 20-12.5 MG tablet Commonly known as:  PRINZIDE,ZESTORETIC Take 1 tablet by mouth daily.   multivitamin tablet Take 1 tablet by mouth daily.   simvastatin 80 MG tablet Commonly known as:  ZOCOR Take 80 mg by mouth daily.   SM OMEGA-3-6-9 FATTY ACIDS PO Take by mouth.   tamsulosin 0.4 MG Caps capsule Commonly known as:  FLOMAX Take 1 capsule (0.4 mg total) by mouth  daily.   vitamin E 400 UNIT capsule Generic drug:  vitamin E Take 400 Units by mouth daily.       Allergies: No Known Allergies  Family History: Family History  Problem Relation Age of Onset  . Prostate cancer Father     Social History:  reports that he has quit smoking. His smoking use included Cigarettes. He has never used smokeless tobacco. He reports that he does not drink alcohol or use drugs.  Physical Exam: BP (!) 144/105   Pulse 80   Ht 5\' 8"  (1.727 m)   Wt 230 lb (104.3 kg)   BMI 34.97 kg/m   Constitutional:  Alert and oriented, No acute distress.  Presents with wife today HEENT: [place] AT, moist mucus membranes.  Trachea midline, no masses. Cardiovascular: No clubbing, cyanosis, or edema.   Respiratory: Normal respiratory effort, no increased work of breathing. Skin: No rashes, bruises or suspicious lesions.   DRE: Normal sphincter tone.  Enlarged 50+ cc prostate, no nodules.   Neurologic: Grossly intact, no focal deficits, moving all 4 extremities. Psychiatric: Normal mood and affect.  Laboratory Data: Results for orders placed or performed in visit on 10/25/16  BLADDER SCAN AMB NON-IMAGING  Result Value Ref Range   Scan Result 175ml     Assessment & Plan:    1. Elevated PSA-  S/p neg biopsy 177 g prostate, PSA likely related to volume  --> appropriate drop with finasteride PSA pending- will call with results  2. BPH with LUTS As below   3. Incomplete bladder emptying History of elevated PVR 100-200 cc/ UTI/ history of urinary retention, stable today Asymptomatic, symptoms stable Continue Flomax/ finasteride (started 03/2015)  Return in about 1 year (around 10/25/2017) for PSA/ DRE, IPSS, PVR.  Hollice Espy, MD  [facility] [phone]

## 2016-10-26 LAB — PSA: Prostatic Specific Antigen: 4.1 ng/mL — ABNORMAL HIGH (ref 0.00–4.00)

## 2016-10-28 ENCOUNTER — Telehealth: Payer: Self-pay | Admitting: Family Medicine

## 2016-10-28 NOTE — Telephone Encounter (Signed)
-----   Message from Hollice Espy, MD sent at 10/27/2016  2:42 PM EDT ----- PSA remains relatively stable.  4.10 up slightly from 3.75.    Hollice Espy, MD

## 2016-10-28 NOTE — Telephone Encounter (Signed)
Left message for patient to return call.

## 2016-10-29 NOTE — Telephone Encounter (Signed)
Spoke with pt wife in reference to lab results. Wife voiced understanding.

## 2016-11-07 ENCOUNTER — Other Ambulatory Visit: Payer: Self-pay | Admitting: Family Medicine

## 2016-11-07 DIAGNOSIS — N400 Enlarged prostate without lower urinary tract symptoms: Secondary | ICD-10-CM

## 2016-11-07 MED ORDER — TAMSULOSIN HCL 0.4 MG PO CAPS: 0.4000 mg | ORAL_CAPSULE | Freq: Every day | ORAL | 3 refills | Status: DC

## 2016-11-15 ENCOUNTER — Other Ambulatory Visit: Payer: Self-pay

## 2016-11-15 DIAGNOSIS — N400 Enlarged prostate without lower urinary tract symptoms: Secondary | ICD-10-CM

## 2016-11-15 MED ORDER — TAMSULOSIN HCL 0.4 MG PO CAPS: 0.4000 mg | ORAL_CAPSULE | Freq: Every day | ORAL | 3 refills | Status: DC

## 2016-11-20 ENCOUNTER — Other Ambulatory Visit: Payer: Self-pay

## 2016-11-20 MED ORDER — FINASTERIDE 5 MG PO TABS: 5.0000 mg | ORAL_TABLET | Freq: Every day | ORAL | 3 refills | Status: DC

## 2017-10-31 ENCOUNTER — Ambulatory Visit: Payer: Medicare Other | Admitting: Urology

## 2017-11-28 ENCOUNTER — Other Ambulatory Visit: Payer: Self-pay | Admitting: *Deleted

## 2017-11-28 ENCOUNTER — Telehealth: Payer: Self-pay | Admitting: Urology

## 2017-11-28 DIAGNOSIS — N400 Enlarged prostate without lower urinary tract symptoms: Secondary | ICD-10-CM

## 2017-11-28 MED ORDER — TAMSULOSIN HCL 0.4 MG PO CAPS: 0.4000 mg | ORAL_CAPSULE | Freq: Every day | ORAL | 0 refills | Status: DC

## 2017-11-28 NOTE — Telephone Encounter (Signed)
Patient called and  would like a refill on Tamsulosin(flomax) 0.4 mg caps called into [facility] in [place] on [address] Has appointment with Dr Erlene Quan on 12-19-17 @ 2:45pm.

## 2017-11-28 NOTE — Telephone Encounter (Signed)
1 refill on flomax until his appt.

## 2017-12-10 ENCOUNTER — Other Ambulatory Visit: Payer: Self-pay | Admitting: Family Medicine

## 2017-12-10 MED ORDER — FINASTERIDE 5 MG PO TABS: 5.0000 mg | ORAL_TABLET | Freq: Every day | ORAL | 3 refills | Status: DC

## 2017-12-19 ENCOUNTER — Ambulatory Visit (INDEPENDENT_AMBULATORY_CARE_PROVIDER_SITE_OTHER): Payer: Medicare Other | Admitting: Urology

## 2017-12-19 ENCOUNTER — Encounter: Payer: Self-pay | Admitting: Urology

## 2017-12-19 ENCOUNTER — Other Ambulatory Visit
Admission: RE | Admit: 2017-12-19 | Discharge: 2017-12-19 | Disposition: A | Discharge status: Home / Self Care | Payer: Medicare Other | Source: Ambulatory Visit | Attending: Urology | Admitting: Urology

## 2017-12-19 VITALS — BP 146/69 | HR 88 | Ht 69.0 in | Wt 235.0 lb

## 2017-12-19 DIAGNOSIS — N401 Enlarged prostate with lower urinary tract symptoms: Secondary | ICD-10-CM | POA: Diagnosis not present

## 2017-12-19 DIAGNOSIS — N138 Other obstructive and reflux uropathy: Secondary | ICD-10-CM

## 2017-12-19 DIAGNOSIS — R972 Elevated prostate specific antigen [PSA]: Secondary | ICD-10-CM | POA: Insufficient documentation

## 2017-12-19 LAB — BLADDER SCAN AMB NON-IMAGING

## 2017-12-19 NOTE — Progress Notes (Signed)
12/19/2017 2:47 PM   Brandon Brandt 08/03/1945 [id number]  Referring provider: Katheren Shams [address] [place], [place] [address]  Chief Complaint  Patient presents with  . Benign Prostatic Hypertrophy    1 year w/IPSS    HPI:  72 year old male with a history of BPH, elevated PSA and history of epididymoorchitis.  Elevated PSA Biopsy NEGATIVE for malignancy 2/17, acute and chronic inflammation noted.  TRUS vol 177 cc.   PSA History: PSA today pending 10/25/2016  4.10 10/18/15  PSA 3.5 03/15/15     PSA 8.2  --> started on finasteride  03/08/15 PSA 9.0  Previous CT renal stone protocol 01/02/15 noting a significantly enlarged prostate, measuring 6.4 cm in transverse dimension, and 7.6 cm in craniocaudal dimension. No stones or other GU pathology.    BPH with LUTS Previous CT renal stone protocol 01/02/15 noting a significantly enlarged prostate, measuring 6.4 cm in transverse dimension, and 7.6 cm in craniocaudal dimension. No stones or other GU pathology  He is currently taking Flomax and finasteride for LUTS .  IPSS 5/1.  No complaints today, happy with voiding symptoms . Nocturia x 1-2.  Minimal bother.  Essentially unchanged.  No UTIs or scrotal infections since last visit.  No dysuria or gross hematuria.    No stones.    PVR 99 cc  IPSS    Row Name 12/19/17 1400         International Prostate Symptom Score   How often have you had the sensation of not emptying your bladder?  Less than 1 in 5     How often have you had to urinate less than every two hours?  Less than half the time     How often have you found you stopped and started again several times when you urinated?  Not at All     How often have you found it difficult to postpone urination?  Not at All     How often have you had a weak urinary stream?  Less than 1 in 5 times     How often have you had to strain to start urination?  Not at All     How many times did you typically get  up at night to urinate?  1 Time     Total IPSS Score  5       Quality of Life due to urinary symptoms   If you were to spend the rest of your life with your urinary condition just the way it is now how would you feel about that?  Pleased        Score:  1-7 Mild 8-19 Moderate 20-35 Severe   PMH: Past Medical History:  Diagnosis Date  . Adenomatous polyps   . Benign hypertension 01/24/2015  . CAD in native artery 01/24/2015  . Coronary artery disease   . Hypertension   . Impaired glucose tolerance   . Pure hypercholesterolemia   . Shortness of breath dyspnea     Surgical History: Past Surgical History:  Procedure Laterality Date  . CARDIAC CATHETERIZATION    . COLONOSCOPY    . COLONOSCOPY WITH PROPOFOL N/A 01/02/2015   Procedure: COLONOSCOPY WITH PROPOFOL;  Surgeon: Manya Silvas, MD;  Location: [facility] ENDOSCOPY;  Service: Endoscopy;  Laterality: N/A;    Home Medications:  Allergies as of 12/19/2017   No Known Allergies     Medication List        Accurate as of 12/19/17  2:47 PM.  Always use your most recent med list.          albuterol 108 (90 Base) MCG/ACT inhaler Commonly known as:  PROVENTIL HFA;VENTOLIN HFA Inhale into the lungs every 6 (six) hours as needed for wheezing or shortness of breath.   ascorbic acid 500 MG tablet Commonly known as:  VITAMIN C Take 500 mg by mouth daily.   aspirin 81 MG tablet Take 81 mg by mouth daily. Reported on 04/12/2015   finasteride 5 MG tablet Commonly known as:  PROSCAR Take 1 tablet (5 mg total) by mouth daily.   lisinopril-hydrochlorothiazide 20-12.5 MG tablet Commonly known as:  PRINZIDE,ZESTORETIC Take 1 tablet by mouth daily.   multivitamin tablet Take 1 tablet by mouth daily.   simvastatin 80 MG tablet Commonly known as:  ZOCOR Take 80 mg by mouth daily.   SM OMEGA-3-6-9 FATTY ACIDS PO Take by mouth.   tamsulosin 0.4 MG Caps capsule Commonly known as:  FLOMAX Take 1 capsule (0.4 mg total) by  mouth daily.   vitamin E 400 UNIT capsule Generic drug:  vitamin E Take 400 Units by mouth daily.       Allergies: No Known Allergies  Family History: Family History  Problem Relation Age of Onset  . Prostate cancer Father     Social History:  reports that he has quit smoking. His smoking use included cigarettes. He has never used smokeless tobacco. He reports that he does not drink alcohol or use drugs.  ROS: UROLOGY Frequent Urination?: No Hard to postpone urination?: No Burning/pain with urination?: No Get up at night to urinate?: No Leakage of urine?: No Urine stream starts and stops?: No Trouble starting stream?: No Do you have to strain to urinate?: No Blood in urine?: No Urinary tract infection?: No Sexually transmitted disease?: No Injury to kidneys or bladder?: No Painful intercourse?: No Weak stream?: No Erection problems?: No Penile pain?: No  Gastrointestinal Nausea?: No Vomiting?: No Indigestion/heartburn?: No Diarrhea?: No Constipation?: No  Constitutional Fever: No Night sweats?: No Weight loss?: No Fatigue?: No  Skin Skin rash/lesions?: No Itching?: No  Eyes Blurred vision?: No Double vision?: No  Ears/Nose/Throat Sore throat?: No Sinus problems?: No  Hematologic/Lymphatic Swollen glands?: No Easy bruising?: No  Cardiovascular Leg swelling?: No Chest pain?: No  Respiratory Cough?: No Shortness of breath?: No  Endocrine Excessive thirst?: No  Musculoskeletal Back pain?: No Joint pain?: No  Neurological Headaches?: No Dizziness?: No  Psychologic Depression?: No Anxiety?: No  Physical Exam: BP (!) 146/69   Pulse 88   Ht 5\' 9"  (1.753 m)   Wt 235 lb (106.6 kg)   BMI 34.70 kg/m   Constitutional:  Alert and oriented, No acute distress. HEENT: [place] AT, moist mucus membranes.  Trachea midline, no masses. Cardiovascular: No clubbing, cyanosis, or edema. Respiratory: Normal respiratory effort, no increased work of  breathing. GI: Abdomen is soft, nontender, nondistended, no abdominal masses GU: No CVA tenderness Rectal: Normal sphincter tone.  Enlarged 50+ cc prostate, no nodules, nontender. Skin: No rashes, bruises or suspicious lesions. Neurologic: Grossly intact, no focal deficits, moving all 4 extremities. Psychiatric: Normal mood and affect.  Laboratory Data: Lab Results  Component Value Date   WBC 19.0 (H) 01/19/2015   HGB 13.9 01/19/2015   HCT 41.8 01/19/2015   MCV 90.6 01/19/2015   PLT 227 01/19/2015    Lab Results  Component Value Date   CREATININE 0.64 01/19/2015   PSA pending  Urinalysis NA  Pertinent Imaging:  Assessment & Plan:  1. Benign prostatic hyperplasia with urinary obstruction Symptoms stable Continue finasteride/ flomax Not interested in surgery Adequate emptying - BLADDER SCAN AMB NON-IMAGING  2. Elevated PSA Continue to monitor PSA pending today- will call  - PSA; Future   Return in about 1 year (around 12/20/2018) for IPSS/ PVR/ PSA.  Hollice Espy, MD  [facility] [address] [place], [place] [address] [phone]

## 2017-12-20 LAB — PSA: Prostatic Specific Antigen: 5.08 ng/mL — ABNORMAL HIGH (ref 0.00–4.00)

## 2017-12-23 ENCOUNTER — Other Ambulatory Visit: Payer: Self-pay | Admitting: Family Medicine

## 2017-12-23 DIAGNOSIS — N400 Enlarged prostate without lower urinary tract symptoms: Secondary | ICD-10-CM

## 2017-12-23 MED ORDER — TAMSULOSIN HCL 0.4 MG PO CAPS: 0.4000 mg | ORAL_CAPSULE | Freq: Every day | ORAL | 6 refills | Status: DC

## 2017-12-29 ENCOUNTER — Telehealth: Payer: Self-pay

## 2017-12-29 DIAGNOSIS — R972 Elevated prostate specific antigen [PSA]: Secondary | ICD-10-CM

## 2017-12-29 NOTE — Telephone Encounter (Signed)
Called pt informed him of the information below. Lab order placed. Pt gave verbal understanding, also scheduled pt f/u appt with Dr.Brandon.

## 2017-12-29 NOTE — Telephone Encounter (Signed)
-----   Message from Hollice Espy, MD sent at 12/29/2017  4:08 PM EDT ----- Your PSA but continues to rise despite being on finasteride.  It is risen about a point since last year which is above the threshold of normal.  I like to repeat it again in about 4 months.  If it continues to rise, I would recommend prostate MRI.  Please arrange follow-up with me in 4 months with PSA 1 to 2 days prior.  Hollice Espy, MD

## 2018-04-30 ENCOUNTER — Other Ambulatory Visit: Payer: Self-pay

## 2018-05-05 ENCOUNTER — Ambulatory Visit: Payer: Self-pay | Admitting: Urology

## 2018-05-11 ENCOUNTER — Other Ambulatory Visit
Admission: RE | Admit: 2018-05-11 | Discharge: 2018-05-11 | Disposition: A | Discharge status: Home / Self Care | Payer: Medicare Other | Attending: Urology | Admitting: Urology

## 2018-05-11 ENCOUNTER — Other Ambulatory Visit: Payer: Self-pay

## 2018-05-11 DIAGNOSIS — R972 Elevated prostate specific antigen [PSA]: Secondary | ICD-10-CM | POA: Insufficient documentation

## 2018-05-11 LAB — PSA: PROSTATIC SPECIFIC ANTIGEN: 3.87 ng/mL (ref 0.00–4.00)

## 2018-05-15 ENCOUNTER — Ambulatory Visit: Payer: Self-pay | Admitting: Urology

## 2018-05-15 ENCOUNTER — Telehealth: Payer: Self-pay | Admitting: Urology

## 2018-05-15 NOTE — Telephone Encounter (Signed)
Pt called and states that he doesn't have time right now to r/s. He is at work, He will call back to r/s at a later date.

## 2018-05-15 NOTE — Telephone Encounter (Signed)
It is imperative that this patient follow-up with a PSA prior.  We will make some decisions based on this.  Please contact him to reschedule.  Hollice Espy, MD

## 2018-05-18 NOTE — Telephone Encounter (Signed)
Left pt mess to reschedule

## 2018-05-29 ENCOUNTER — Ambulatory Visit: Payer: Self-pay | Admitting: Urology

## 2018-05-29 NOTE — Telephone Encounter (Signed)
Due to CoVid 19 virus we are suspending prostate biopsy procedures at this time. Please place him on a recall list to schedule when able

## 2018-06-26 ENCOUNTER — Ambulatory Visit: Payer: Self-pay | Admitting: Urology

## 2018-08-28 ENCOUNTER — Ambulatory Visit: Payer: Self-pay | Admitting: Urology

## 2018-09-15 ENCOUNTER — Other Ambulatory Visit: Payer: Self-pay

## 2018-09-15 ENCOUNTER — Telehealth: Payer: Self-pay

## 2018-09-15 DIAGNOSIS — R972 Elevated prostate specific antigen [PSA]: Secondary | ICD-10-CM

## 2018-09-15 NOTE — Telephone Encounter (Signed)
Left message for patient to return call. He will need to have a PSA drawn prior to his Friday 09/18/18 appt. Order is in.

## 2018-09-18 ENCOUNTER — Ambulatory Visit: Payer: Self-pay | Admitting: Urology

## 2018-10-16 ENCOUNTER — Ambulatory Visit: Payer: Self-pay | Admitting: Urology

## 2018-10-16 ENCOUNTER — Ambulatory Visit: Payer: Medicare Other | Admitting: Urology

## 2018-11-05 ENCOUNTER — Other Ambulatory Visit: Payer: Self-pay

## 2018-11-05 ENCOUNTER — Other Ambulatory Visit
Admission: RE | Admit: 2018-11-05 | Discharge: 2018-11-05 | Disposition: A | Discharge status: Home / Self Care | Payer: Medicare Other | Attending: Urology | Admitting: Urology

## 2018-11-05 DIAGNOSIS — R972 Elevated prostate specific antigen [PSA]: Secondary | ICD-10-CM | POA: Insufficient documentation

## 2018-11-05 LAB — PSA: Prostatic Specific Antigen: 2.45 ng/mL (ref 0.00–4.00)

## 2018-11-13 ENCOUNTER — Ambulatory Visit: Payer: Medicare Other | Admitting: Urology

## 2018-11-13 ENCOUNTER — Other Ambulatory Visit: Payer: Self-pay

## 2018-11-13 ENCOUNTER — Encounter: Payer: Self-pay | Admitting: Urology

## 2018-11-13 VITALS — BP 177/71 | HR 84 | Ht 69.0 in | Wt 235.0 lb

## 2018-11-13 DIAGNOSIS — N401 Enlarged prostate with lower urinary tract symptoms: Secondary | ICD-10-CM | POA: Diagnosis not present

## 2018-11-13 DIAGNOSIS — N138 Other obstructive and reflux uropathy: Secondary | ICD-10-CM

## 2018-11-13 DIAGNOSIS — R339 Retention of urine, unspecified: Secondary | ICD-10-CM | POA: Diagnosis not present

## 2018-11-13 LAB — BLADDER SCAN AMB NON-IMAGING

## 2018-11-13 NOTE — Progress Notes (Signed)
11/13/2018 2:53 PM   Brandon Brandt [birth year] [id number]  Referring provider: Medicine, [facility] [address] [place],  [place] [address]  Chief Complaint  Patient presents with  . Benign Prostatic Hypertrophy    HPI: 73 year old male with a history of BPH and elevated PSA as well as left epididymoorchitis who returns today for routine annual follow-up.  He was last seen in 12/2017.  Elevated PSA Biopsy NEGATIVE for malignancy 2/17, acute and chronic inflammation noted. TRUS vol 177 cc.   PSA History: 8/20   2.45 3/20   3.87 10/19   5.08 10/25/2016  4.10 10/18/15 PSA 3.5 03/15/15 PSA 8.2 --> started on finasteride 03/08/15 PSA 9.0  Previous CT renal stone protocol 01/02/15 noting a significantly enlarged prostate, measuring 6.4 cm in transverse dimension, and 7.6 cm in craniocaudal dimension. No stones or other GU pathology.    BPH with LUTS Previous CT renal stone protocol 01/02/15 noting a significantly enlarged prostate, measuring 6.4 cm in transverse dimension, and 7.6 cm in craniocaudal dimension. No stones or other GU pathology  He is currently taking Flomax and finasteride for LUTS . IPSS . No complaints today, happy with voiding symptoms . Nocturia x 2. Minimal bother.  Essentially unchanged.  No UTIs or scrotal infections since last visit.No dysuria or gross hematuria.   No stones.    Continues to be uninterested in surgery.  PVR today borderline approximate 150.  IPSS    Row Name 11/13/18 1400         International Prostate Symptom Score   How often have you had the sensation of not emptying your bladder?  Not at All     How often have you had to urinate less than every two hours?  Less than 1 in 5 times     How often have you found you stopped and started again several times when you urinated?  Not at All     How often have you found it difficult to postpone urination?  Not at All     How often have you had a weak  urinary stream?  Less than 1 in 5 times     How often have you had to strain to start urination?  Less than 1 in 5 times     How many times did you typically get up at night to urinate?  1 Time     Total IPSS Score  4       Quality of Life due to urinary symptoms   If you were to spend the rest of your life with your urinary condition just the way it is now how would you feel about that?  Pleased        Score:  1-7 Mild 8-19 Moderate 20-35 Severe    PMH: Past Medical History:  Diagnosis Date  . Adenomatous polyps   . Benign hypertension 01/24/2015  . CAD in native artery 01/24/2015  . Coronary artery disease   . Hypertension   . Impaired glucose tolerance   . Pure hypercholesterolemia   . Shortness of breath dyspnea     Surgical History: Past Surgical History:  Procedure Laterality Date  . CARDIAC CATHETERIZATION    . COLONOSCOPY    . COLONOSCOPY WITH PROPOFOL N/A 01/02/2015   Procedure: COLONOSCOPY WITH PROPOFOL;  Surgeon: Manya Silvas, MD;  Location: [facility] ENDOSCOPY;  Service: Endoscopy;  Laterality: N/A;    Home Medications:  Allergies as of 11/13/2018   No Known Allergies  Medication List       Accurate as of November 13, 2018  2:53 PM. If you have any questions, ask your nurse or doctor.        albuterol 108 (90 Base) MCG/ACT inhaler Commonly known as: VENTOLIN HFA Inhale into the lungs every 6 (six) hours as needed for wheezing or shortness of breath.   ascorbic acid 500 MG tablet Commonly known as: VITAMIN C Take 500 mg by mouth daily.   aspirin 81 MG tablet Take 81 mg by mouth daily. Reported on 04/12/2015   finasteride 5 MG tablet Commonly known as: PROSCAR Take 1 tablet (5 mg total) by mouth daily.   lisinopril-hydrochlorothiazide 20-12.5 MG tablet Commonly known as: ZESTORETIC Take 1 tablet by mouth daily.   multivitamin tablet Take 1 tablet by mouth daily.   simvastatin 80 MG tablet Commonly known as: ZOCOR Take 80 mg by mouth  daily.   SM OMEGA-3-6-9 FATTY ACIDS PO Take by mouth.   tamsulosin 0.4 MG Caps capsule Commonly known as: FLOMAX Take 1 capsule (0.4 mg total) by mouth daily.   vitamin E 400 UNIT capsule Generic drug: vitamin E Take 400 Units by mouth daily.       Allergies: No Known Allergies  Family History: Family History  Problem Relation Age of Onset  . Prostate cancer Father     Social History:  reports that he has quit smoking. His smoking use included cigarettes. He has never used smokeless tobacco. He reports that he does not drink alcohol or use drugs.  ROS: UROLOGY Frequent Urination?: No Hard to postpone urination?: No Burning/pain with urination?: No Get up at night to urinate?: No Leakage of urine?: No Urine stream starts and stops?: No Trouble starting stream?: No Do you have to strain to urinate?: No Blood in urine?: No Urinary tract infection?: No Sexually transmitted disease?: No Injury to kidneys or bladder?: No Painful intercourse?: No Weak stream?: No Erection problems?: No Penile pain?: No  Gastrointestinal Nausea?: No Vomiting?: No Indigestion/heartburn?: No Diarrhea?: No Constipation?: No  Constitutional Fever: No Night sweats?: No Weight loss?: No Fatigue?: No  Skin Skin rash/lesions?: No Itching?: No  Eyes Blurred vision?: No Double vision?: No  Ears/Nose/Throat Sore throat?: No Sinus problems?: No  Hematologic/Lymphatic Swollen glands?: No Easy bruising?: No  Cardiovascular Leg swelling?: No Chest pain?: No  Respiratory Cough?: No Shortness of breath?: No  Endocrine Excessive thirst?: No  Musculoskeletal Back pain?: No Joint pain?: No  Neurological Headaches?: No Dizziness?: No  Psychologic Depression?: No Anxiety?: No  Physical Exam: BP (!) 177/71   Pulse 84   Ht 5\' 9"  (1.753 m)   Wt 235 lb (106.6 kg)   BMI 34.70 kg/m   Constitutional:  Alert and oriented, No acute distress. HEENT: [place] AT, moist mucus  membranes.  Trachea midline, no masses. Cardiovascular: No clubbing, cyanosis, or edema. Respiratory: Normal respiratory effort, no increased work of breathing. GI: Abdomen is soft, nontender, nondistended, no abdominal masses Rectal: Normal sphincter tone.  Enlarged prostate 50+ cc, nontender, no nodules. Skin: No rashes, bruises or suspicious lesions. Neurologic: Grossly intact, no focal deficits, moving all 4 extremities. Psychiatric: Normal mood and affect.  Laboratory Data: Lab Results  Component Value Date   WBC 19.0 (H) 01/19/2015   HGB 13.9 01/19/2015   HCT 41.8 01/19/2015   MCV 90.6 01/19/2015   PLT 227 01/19/2015    Lab Results  Component Value Date   CREATININE 0.64 01/19/2015     Pertinent Imaging: Results for  orders placed or performed in visit on 11/13/18  Bladder Scan (Post Void Residual) in office  Result Value Ref Range   Scan Result 148ml     Assessment & Plan:    1. Benign prostatic hyperplasia with urinary obstruction Symptoms stable/well controlled on Flomax and finasteride Not interested in surgical intervention We will continue maximal medical therapy Follow-up in 1 year for IPSS/PVR PSA is back down to baseline, reassuring, will continue to follow annually - Bladder Scan (Post Void Residual) in office - PSA; Future  2. Incomplete bladder emptying Borderline elevated PSA today without sequela We will continue to follow with PVR next year   Return in about 1 year (around 11/13/2019) for IPSS/ PVR/ DRE/ PSA.  Hollice Espy, MD  [facility] [address], [place], [place] [address] [phone]

## 2018-11-19 ENCOUNTER — Other Ambulatory Visit: Payer: Self-pay | Admitting: Gerontology

## 2018-11-19 ENCOUNTER — Ambulatory Visit
Admission: RE | Admit: 2018-11-19 | Discharge: 2018-11-19 | Disposition: A | Discharge status: Home / Self Care | Payer: Medicare Other | Source: Ambulatory Visit | Attending: Gerontology | Admitting: Gerontology

## 2018-11-19 ENCOUNTER — Other Ambulatory Visit: Payer: Self-pay

## 2018-11-19 DIAGNOSIS — M79605 Pain in left leg: Secondary | ICD-10-CM | POA: Insufficient documentation

## 2018-12-18 ENCOUNTER — Ambulatory Visit: Payer: Self-pay | Admitting: Urology

## 2019-11-19 ENCOUNTER — Ambulatory Visit: Payer: Medicare Other | Admitting: Urology

## 2019-11-25 NOTE — Progress Notes (Addendum)
11/26/2019 2:33 PM   Brandon Brandt 07-Oct-1945 [id number]  Referring provider: Sofie Hartigan, MD [place],  [place] [id number] Chief Complaint  Patient presents with  . Follow-up    1 year follow-up    HPI: Brandon Brandt is a 74 y.o. male who returns for a annual follow up of BPH with urinary obstruction and incomplete bladder emptying.   Elevated PSA Biopsy NEGATIVE for malignancy 2/17, acute and chronic inflammation noted. TRUS vol 177 cc.   PSA History: 8/20   2.45 3/20   3.87 10/19   5.08 10/25/2016 4.10 10/18/15 PSA 3.5 03/15/15 PSA 8.2 -->started on finasteride 03/08/15 PSA 9.0  Previous CT renal stone protocol 01/02/15 noting a significantly enlarged prostate, measuring 6.4 cm in transverse dimension, and 7.6 cm in craniocaudal dimension. No stones or other GU pathology.  BPH with LUTS Previous CT renal stone protocol 01/02/15 noting a significantly enlarged prostate, measuring 6.4 cm in transverse dimension, and 7.6 cm in craniocaudal dimension. No stones or other GU pathology  Today's PVR 159 mL. He has no urinary complaints today.    IPSS    Row Name 11/26/19 1500         International Prostate Symptom Score   How often have you had the sensation of not emptying your bladder? Less than 1 in 5     How often have you had to urinate less than every two hours? Less than 1 in 5 times     How often have you found you stopped and started again several times when you urinated? Not at All     How often have you found it difficult to postpone urination? Not at All     How often have you had a weak urinary stream? Less than 1 in 5 times     How often have you had to strain to start urination? Not at All     How many times did you typically get up at night to urinate? 3 Times     Total IPSS Score 6       Quality of Life due to urinary symptoms   If you were to spend the rest of your life with your urinary condition just the way it is now how  would you feel about that? Pleased             Score:  1-7 Mild 8-19 Moderate 20-35 Severe  PMH: Past Medical History:  Diagnosis Date  . Adenomatous polyps   . Benign hypertension 01/24/2015  . CAD in native artery 01/24/2015  . Coronary artery disease   . Hypertension   . Impaired glucose tolerance   . Pure hypercholesterolemia   . Shortness of breath dyspnea     Surgical History: Past Surgical History:  Procedure Laterality Date  . CARDIAC CATHETERIZATION    . COLONOSCOPY    . COLONOSCOPY WITH PROPOFOL N/A 01/02/2015   Procedure: COLONOSCOPY WITH PROPOFOL;  Surgeon: Manya Silvas, MD;  Location: [facility] ENDOSCOPY;  Service: Endoscopy;  Laterality: N/A;    Home Medications:  Allergies as of 11/26/2019   No Known Allergies     Medication List       Accurate as of November 26, 2019 11:59 PM. If you have any questions, ask your nurse or doctor.        albuterol 108 (90 Base) MCG/ACT inhaler Commonly known as: VENTOLIN HFA Inhale into the lungs every 6 (six) hours as needed for wheezing or  shortness of breath.   ascorbic acid 500 MG tablet Commonly known as: VITAMIN C Take 500 mg by mouth daily.   aspirin 81 MG tablet Take 81 mg by mouth daily. Reported on 04/12/2015   finasteride 5 MG tablet Commonly known as: PROSCAR Take 1 tablet (5 mg total) by mouth daily.   lisinopril-hydrochlorothiazide 20-12.5 MG tablet Commonly known as: ZESTORETIC Take 1 tablet by mouth daily.   multivitamin tablet Take 1 tablet by mouth daily.   simvastatin 80 MG tablet Commonly known as: ZOCOR Take 80 mg by mouth daily.   SM OMEGA-3-6-9 FATTY ACIDS PO Take by mouth.   tamsulosin 0.4 MG Caps capsule Commonly known as: FLOMAX Take 1 capsule (0.4 mg total) by mouth daily.   vitamin E 180 MG (400 UNITS) capsule Generic drug: vitamin E Take 400 Units by mouth daily.       Allergies: No Known Allergies  Family History: Family History  Problem Relation Age of  Onset  . Prostate cancer Father     Social History:  reports that he has quit smoking. His smoking use included cigarettes. He has never used smokeless tobacco. He reports that he does not drink alcohol and does not use drugs.   Physical Exam: BP (!) 153/71   Pulse 83   Ht 5\' 9"  (1.753 m)   Wt 229 lb (103.9 kg)   BMI 33.82 kg/m   Constitutional:  Alert and oriented, No acute distress. HEENT: [place] AT, moist mucus membranes.  Trachea midline, no masses. Cardiovascular: No clubbing, cyanosis, or edema. Respiratory: Normal respiratory effort, no increased work of breathing. GI: Abdomen is soft, nontender, nondistended, no abdominal masses GU: No CVA tenderness.  Rectal: Patient with  normal sphincter tone. Prostate is approximately 50+ cc grams, no nodules are appreciated. Skin: No rashes, bruises or suspicious lesions. Neurologic: Grossly intact, no focal deficits, moving all 4 extremities. Psychiatric: Normal mood and affect.  Laboratory Data:  Pertinent Imaging: Results for orders placed or performed in visit on 11/13/18  Bladder Scan (Post Void Residual) in office  Result Value Ref Range   Scan Result 170ml     Assessment & Plan:    1. BPH with urinary obstruction Symptoms are stale and well controlled on Flomax and finasteride.  Continue these meds Not interested in surgical intervention  IPSS score: 6, mild.   2. Incomplete bladder emptying  PVR 159 mL.  No recent infections, bladder stones, Cr 0.8 Will continue to follow  At risk for urinary retention/ bladder decompensation or other sequela   3. History of elevated PSA PSA today, will call with results.  Rectal exam unremarkable today, will continue screening given symptoms and being on finasteride   Return in about 1 year (around 11/28/2019)for IPSS/PVR/PSA/DRE   [place] [address], [place], [place] [address] [phone]  I, Selena Batten, am acting as a  scribe for Dr. Hollice Espy.  I have reviewed the above documentation for accuracy and completeness, and I agree with the above.   Hollice Espy, MD

## 2019-11-26 ENCOUNTER — Other Ambulatory Visit: Payer: Self-pay

## 2019-11-26 ENCOUNTER — Other Ambulatory Visit: Admit: 2019-11-26 | Payer: Medicare HMO

## 2019-11-26 ENCOUNTER — Encounter: Payer: Self-pay | Admitting: Urology

## 2019-11-26 ENCOUNTER — Ambulatory Visit: Payer: Medicare HMO | Admitting: Urology

## 2019-11-26 ENCOUNTER — Other Ambulatory Visit: Payer: Self-pay | Admitting: *Deleted

## 2019-11-26 VITALS — BP 153/71 | HR 83 | Ht 69.0 in | Wt 229.0 lb

## 2019-11-26 DIAGNOSIS — R339 Retention of urine, unspecified: Secondary | ICD-10-CM

## 2019-11-26 DIAGNOSIS — R972 Elevated prostate specific antigen [PSA]: Secondary | ICD-10-CM

## 2019-11-26 DIAGNOSIS — N138 Other obstructive and reflux uropathy: Secondary | ICD-10-CM | POA: Diagnosis not present

## 2019-11-26 DIAGNOSIS — N401 Enlarged prostate with lower urinary tract symptoms: Secondary | ICD-10-CM | POA: Diagnosis not present

## 2019-11-30 ENCOUNTER — Other Ambulatory Visit: Payer: Self-pay

## 2019-11-30 ENCOUNTER — Other Ambulatory Visit
Admission: RE | Admit: 2019-11-30 | Discharge: 2019-11-30 | Disposition: A | Discharge status: Home / Self Care | Payer: Medicare HMO | Attending: Urology | Admitting: Urology

## 2019-11-30 DIAGNOSIS — R972 Elevated prostate specific antigen [PSA]: Secondary | ICD-10-CM | POA: Diagnosis present

## 2019-12-01 ENCOUNTER — Telehealth: Payer: Self-pay

## 2019-12-01 LAB — PSA: Prostatic Specific Antigen: 2.35 ng/mL (ref 0.00–4.00)

## 2019-12-01 NOTE — Telephone Encounter (Signed)
Pt wife aware of results

## 2019-12-01 NOTE — Telephone Encounter (Signed)
-----   Message from Hollice Espy, MD sent at 12/01/2019  7:53 AM EDT ----- PSA is stable, 2.35  Hollice Espy, MD

## 2020-02-01 ENCOUNTER — Other Ambulatory Visit: Payer: Self-pay | Admitting: Family Medicine

## 2020-02-01 DIAGNOSIS — E119 Type 2 diabetes mellitus without complications: Secondary | ICD-10-CM

## 2020-02-01 DIAGNOSIS — Z136 Encounter for screening for cardiovascular disorders: Secondary | ICD-10-CM

## 2020-02-15 ENCOUNTER — Ambulatory Visit
Admission: RE | Admit: 2020-02-15 | Discharge: 2020-02-15 | Disposition: A | Discharge status: Home / Self Care | Payer: Medicare HMO | Source: Ambulatory Visit | Attending: Family Medicine | Admitting: Family Medicine

## 2020-02-15 ENCOUNTER — Other Ambulatory Visit: Payer: Self-pay

## 2020-02-15 DIAGNOSIS — Z136 Encounter for screening for cardiovascular disorders: Secondary | ICD-10-CM | POA: Diagnosis present

## 2020-02-15 DIAGNOSIS — E119 Type 2 diabetes mellitus without complications: Secondary | ICD-10-CM | POA: Diagnosis present

## 2020-06-13 IMAGING — US US EXTREM LOW VENOUS
1 series · 13 of 24 positions shown · non-contrast
Comparison: None.

CLINICAL DATA: Leg pain and edema, shortness of breath



[Series 1: us extrem low venous · 0.08mm/px · 70 acquisitions, 13 frames shown]
[im 1/70]
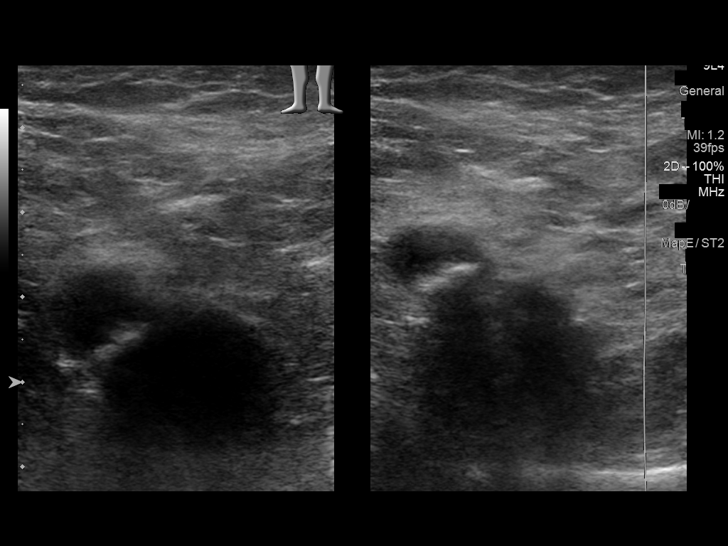
[im 7/70]
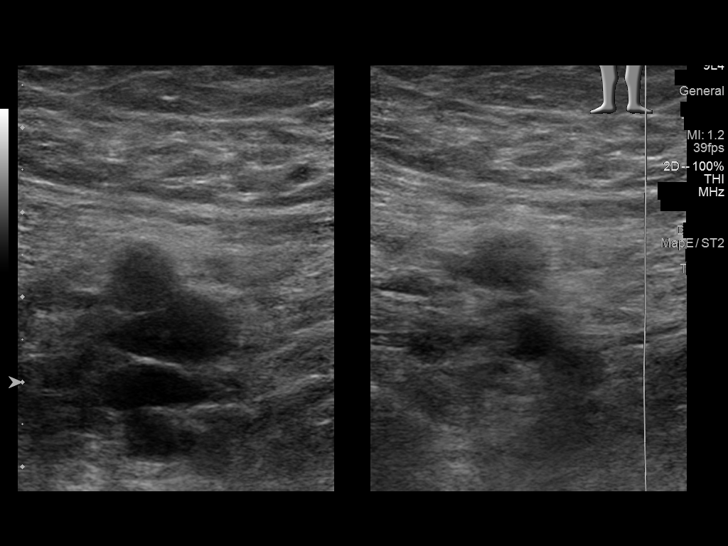
[im 13/70]
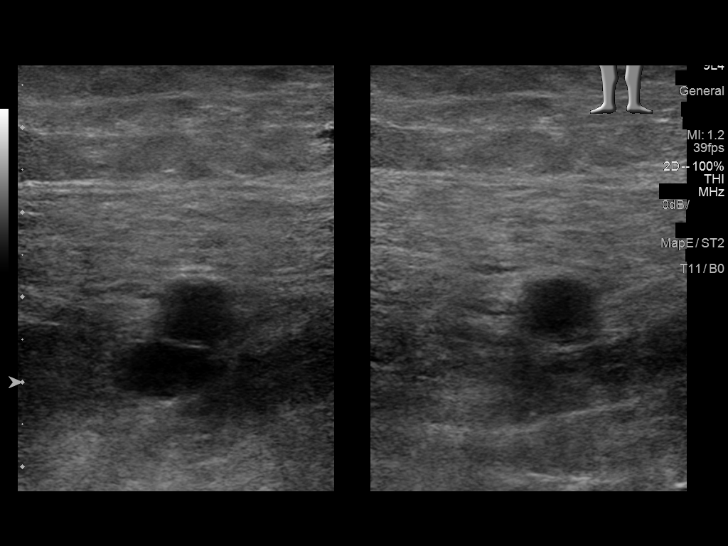
[im 19/70]
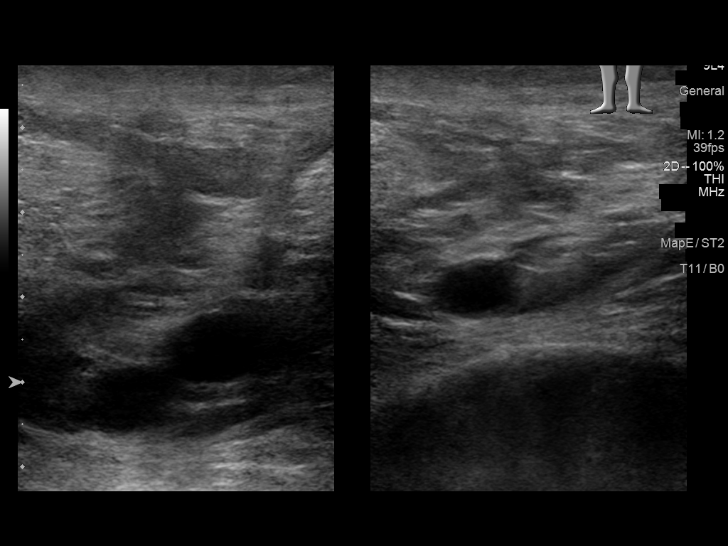
[im 25/70]
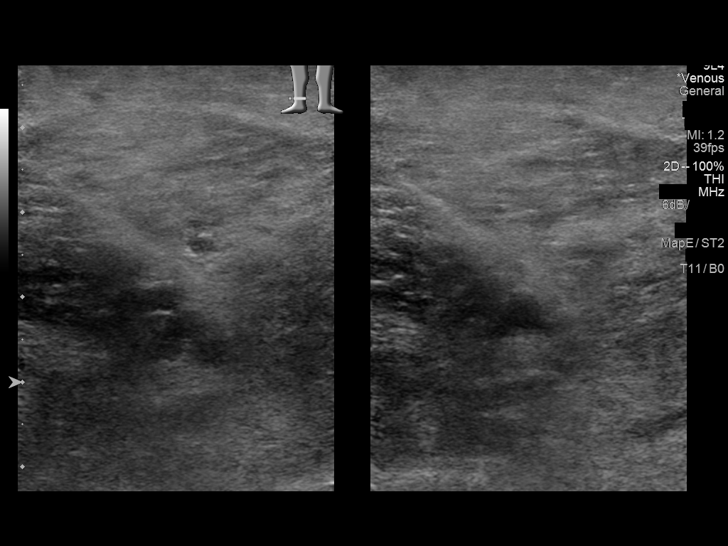
[im 31/70]
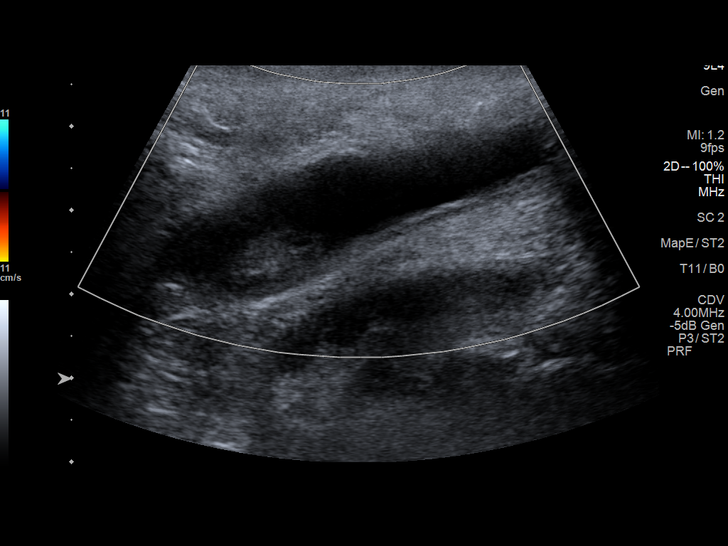
[im 40/70]
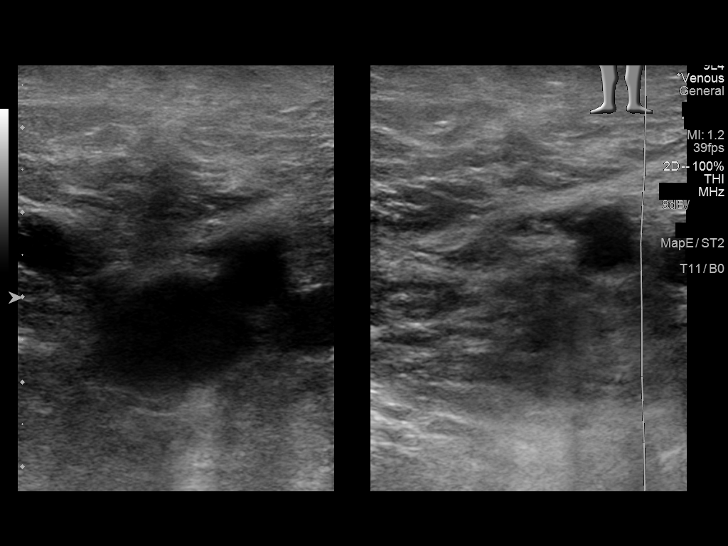
[im 43/70]
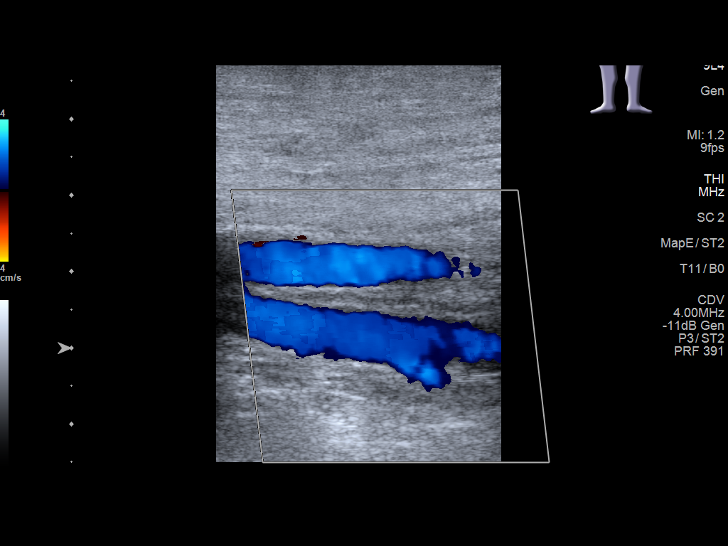
[im 49/70]
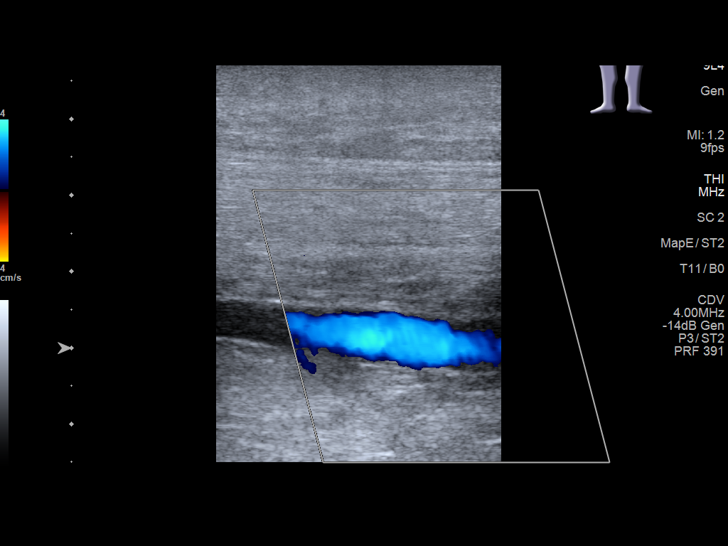
[im 55/70]
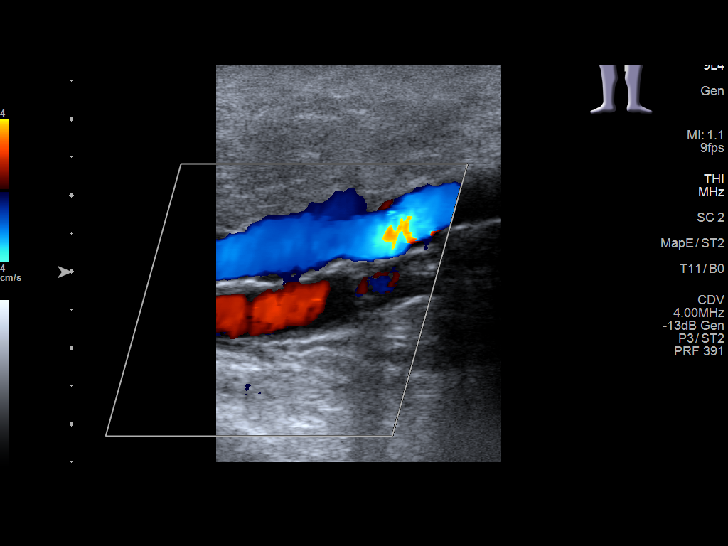
[im 61/70]
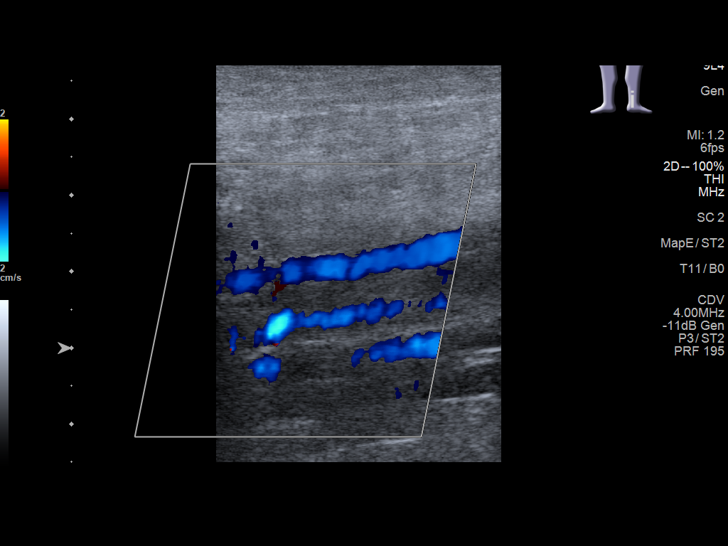
[im 67/70]
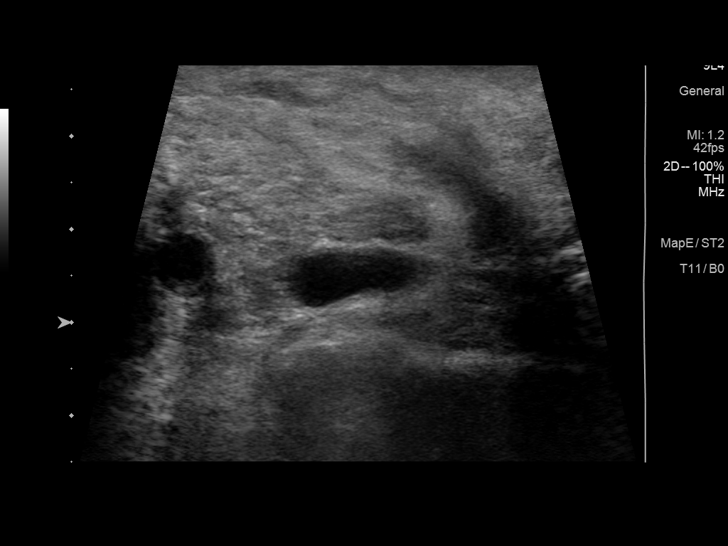
[im 70/70]
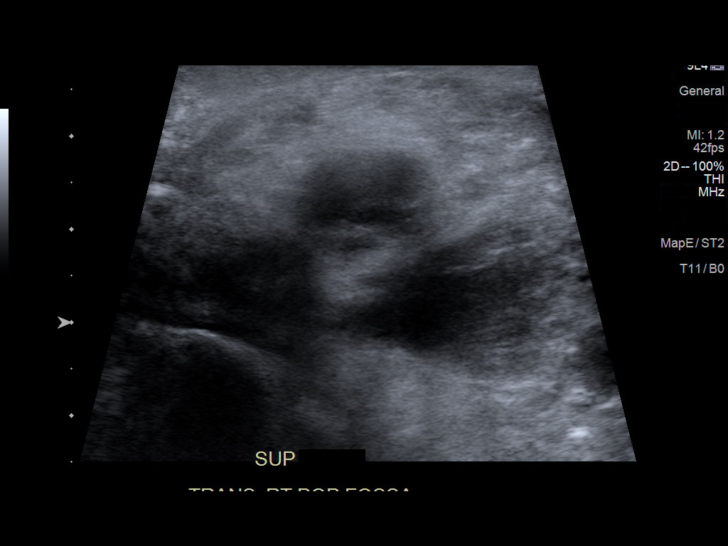

[13 of 24 positions shown; findings below may reference images not displayed]

FINDINGS: RIGHT LOWER EXTREMITY

Common Femoral Vein: No evidence of thrombus. Normal
compressibility, respiratory phasicity and response to augmentation.

Saphenofemoral Junction: No evidence of thrombus. Normal
compressibility and flow on color Doppler imaging.

Profunda Femoral Vein: No evidence of thrombus. Normal
compressibility and flow on color Doppler imaging.

Femoral Vein: No evidence of thrombus. Normal compressibility,
respiratory phasicity and response to augmentation.

Popliteal Vein: No evidence of thrombus. Normal compressibility,
respiratory phasicity and response to augmentation.

Calf Veins: No evidence of thrombus. Normal compressibility and flow
on color Doppler imaging.

Superficial Great Saphenous Vein: No evidence of thrombus. Normal
compressibility.

Venous Reflux:  Not assessed

Other Findings: Popliteal fossa Baker's cyst measures 4.8 x 1.2 x
1.5 cm

LEFT LOWER EXTREMITY

Common Femoral Vein: No evidence of thrombus. Normal
compressibility, respiratory phasicity and response to augmentation.

Saphenofemoral Junction: No evidence of thrombus. Normal
compressibility and flow on color Doppler imaging.

Profunda Femoral Vein: No evidence of thrombus. Normal
compressibility and flow on color Doppler imaging.

Femoral Vein: No evidence of thrombus. Normal compressibility,
respiratory phasicity and response to augmentation.

Popliteal Vein: No evidence of thrombus. Normal compressibility,
respiratory phasicity and response to augmentation.

Calf Veins: No evidence of thrombus. Normal compressibility and flow
on color Doppler imaging.

Superficial Great Saphenous Vein: No evidence of thrombus. Normal
compressibility.

Venous Reflux:  Not assessed

Other Findings: Popliteal fossa Baker's cyst measures 3.2 x 0.6 x
1.5 cm
IMPRESSION: Negative for significant DVT either extremity.

Bilateral Baker's cysts.  Measurements above.

## 2020-06-15 ENCOUNTER — Other Ambulatory Visit: Admission: RE | Admit: 2020-06-15 | Payer: Medicare HMO | Source: Ambulatory Visit

## 2020-06-16 ENCOUNTER — Encounter: Payer: Self-pay | Admitting: *Deleted

## 2020-06-19 ENCOUNTER — Encounter: Payer: Self-pay | Admitting: *Deleted

## 2020-06-19 ENCOUNTER — Ambulatory Visit
Admission: RE | Admit: 2020-06-19 | Discharge: 2020-06-19 | Disposition: A | Discharge status: Home / Self Care | Payer: Medicare HMO | Attending: Gastroenterology | Admitting: Gastroenterology

## 2020-06-19 ENCOUNTER — Other Ambulatory Visit: Payer: Self-pay

## 2020-06-19 ENCOUNTER — Ambulatory Visit: Payer: Medicare HMO | Admitting: Anesthesiology

## 2020-06-19 ENCOUNTER — Encounter
Admission: RE | Disposition: A | Discharge status: Home / Self Care | Payer: Self-pay | Source: Home / Self Care | Attending: Gastroenterology

## 2020-06-19 DIAGNOSIS — Z79899 Other long term (current) drug therapy: Secondary | ICD-10-CM | POA: Diagnosis not present

## 2020-06-19 DIAGNOSIS — Z7982 Long term (current) use of aspirin: Secondary | ICD-10-CM | POA: Diagnosis not present

## 2020-06-19 DIAGNOSIS — D125 Benign neoplasm of sigmoid colon: Secondary | ICD-10-CM | POA: Diagnosis not present

## 2020-06-19 DIAGNOSIS — Z7951 Long term (current) use of inhaled steroids: Secondary | ICD-10-CM | POA: Insufficient documentation

## 2020-06-19 DIAGNOSIS — Z1211 Encounter for screening for malignant neoplasm of colon: Secondary | ICD-10-CM | POA: Diagnosis not present

## 2020-06-19 DIAGNOSIS — D123 Benign neoplasm of transverse colon: Secondary | ICD-10-CM | POA: Insufficient documentation

## 2020-06-19 DIAGNOSIS — K640 First degree hemorrhoids: Secondary | ICD-10-CM | POA: Insufficient documentation

## 2020-06-19 DIAGNOSIS — K5730 Diverticulosis of large intestine without perforation or abscess without bleeding: Secondary | ICD-10-CM | POA: Diagnosis not present

## 2020-06-19 HISTORY — DX: Benign prostatic hyperplasia with lower urinary tract symptoms: N13.8

## 2020-06-19 HISTORY — DX: Benign neoplasm of colon, unspecified: D12.6

## 2020-06-19 HISTORY — DX: Chronic obstructive pulmonary disease, unspecified: J44.9

## 2020-06-19 HISTORY — DX: Urinary tract infection, site not specified: N39.0

## 2020-06-19 HISTORY — PX: COLONOSCOPY WITH PROPOFOL: SHX5780

## 2020-06-19 SURGERY — COLONOSCOPY WITH PROPOFOL
Anesthesia: General

## 2020-06-19 MED ORDER — PROPOFOL 500 MG/50ML IV EMUL
INTRAVENOUS | Status: DC | PRN
  Administered 2020-06-19: 100 ug/kg/min via INTRAVENOUS

## 2020-06-19 MED ORDER — PROPOFOL 10 MG/ML IV BOLUS
INTRAVENOUS | Status: DC | PRN
  Administered 2020-06-19: 80 mg via INTRAVENOUS

## 2020-06-19 MED ORDER — LIDOCAINE HCL (CARDIAC) PF 100 MG/5ML IV SOSY
PREFILLED_SYRINGE | INTRAVENOUS | Status: DC | PRN
  Administered 2020-06-19: 50 mg via INTRAVENOUS

## 2020-06-19 MED ORDER — LIDOCAINE HCL (PF) 2 % IJ SOLN
INTRAMUSCULAR | Status: AC
  Filled 2020-06-19: qty 5

## 2020-06-19 MED ORDER — SODIUM CHLORIDE 0.9 % IV SOLN: INTRAVENOUS | Status: DC

## 2020-06-19 MED ORDER — PROPOFOL 500 MG/50ML IV EMUL
INTRAVENOUS | Status: AC
  Filled 2020-06-19: qty 50

## 2020-06-19 NOTE — Transfer of Care (Signed)
Immediate Anesthesia Transfer of Care Note  Patient: Brandon Brandt  Procedure(s) Performed: COLONOSCOPY WITH PROPOFOL (N/A )  Patient Location: PACU  Anesthesia Type:MAC  Level of Consciousness: drowsy  Airway & Oxygen Therapy: Patient Spontanous Breathing  Post-op Assessment: Report given to RN and Post -op Vital signs reviewed and stable  Post vital signs: stable  Last Vitals:  Vitals Value Taken Time  BP 104/59 06/19/20 1116  Temp 36.1 C 06/19/20 1116  Pulse 67 06/19/20 1116  Resp 10 06/19/20 1116  SpO2 93 % 06/19/20 1116    Last Pain:  Vitals:   06/19/20 1116  TempSrc: Temporal  PainSc: Asleep         Complications: No complications documented.

## 2020-06-19 NOTE — Op Note (Signed)
[facility] Patient Name: Brandon Brandt Procedure Date: 06/19/2020 10:54 AM MRN: [id number] Account #: [id number] Date of Birth: [birth year] Admit Type: Outpatient Age: 75 Room: [facility] ENDO ROOM 3 Gender: Male Note Status: Finalized Procedure:             Colonoscopy Indications:           High risk colon cancer surveillance: Personal history                         of colonic polyps Providers:             Andrey Farmer MD, MD Referring MD:          Sofie Hartigan (Referring MD) Medicines:             Monitored Anesthesia Care Complications:         No immediate complications. Estimated blood loss:                         Minimal. Procedure:             Pre-Anesthesia Assessment:                        - Prior to the procedure, a History and Physical was                         performed, and patient medications and allergies were                         reviewed. The patient is competent. The risks and                         benefits of the procedure and the sedation options and                         risks were discussed with the patient. All questions                         were answered and informed consent was obtained.                         Patient identification and proposed procedure were                         verified by the physician, the nurse, the anesthetist                         and the technician in the endoscopy suite. Mental                         Status Examination: alert and oriented. Airway                         Examination: normal oropharyngeal airway and neck                         mobility. Respiratory Examination: clear to  auscultation. CV Examination: normal. Prophylactic                         Antibiotics: The patient does not require prophylactic                         antibiotics. Prior Anticoagulants: The patient has                         taken no previous anticoagulant or  antiplatelet                         agents. ASA Grade Assessment: III - A patient with                         severe systemic disease. After reviewing the risks and                         benefits, the patient was deemed in satisfactory                         condition to undergo the procedure. The anesthesia                         plan was to use monitored anesthesia care (MAC).                         Immediately prior to administration of medications,                         the patient was re-assessed for adequacy to receive                         sedatives. The heart rate, respiratory rate, oxygen                         saturations, blood pressure, adequacy of pulmonary                         ventilation, and response to care were monitored                         throughout the procedure. The physical status of the                         patient was re-assessed after the procedure.                        After obtaining informed consent, the colonoscope was                         passed under direct vision. Throughout the procedure,                         the patient's blood pressure, pulse, and oxygen                         saturations were monitored continuously. The  Colonoscope was introduced through the anus and                         advanced to the the terminal ileum. The colonoscopy                         was performed without difficulty. The patient                         tolerated the procedure well. The quality of the bowel                         preparation was good. Findings:      The perianal and digital rectal examinations were normal.      The terminal ileum appeared normal.      A 4 mm polyp was found in the proximal transverse colon. The polyp was       sessile. The polyp was removed with a cold snare. Resection and       retrieval were complete. Estimated blood loss was minimal.      Two sessile polyps were found in the  sigmoid colon. The polyps were 3 to       4 mm in size. These polyps were removed with a cold snare. Resection and       retrieval were complete. Estimated blood loss was minimal.      Multiple small-mouthed diverticula were found in the sigmoid colon,       descending colon, transverse colon, hepatic flexure and ascending colon.      Internal hemorrhoids were found during retroflexion. The hemorrhoids       were Grade I (internal hemorrhoids that do not prolapse).      The exam was otherwise without abnormality on direct and retroflexion       views. Impression:            - The examined portion of the ileum was normal.                        - One 4 mm polyp in the proximal transverse colon,                         removed with a cold snare. Resected and retrieved.                        - Two 3 to 4 mm polyps in the sigmoid colon, removed                         with a cold snare. Resected and retrieved.                        - Diverticulosis in the sigmoid colon, in the                         descending colon, in the transverse colon, at the                         hepatic flexure and in the ascending colon.                        -  Internal hemorrhoids.                        - The examination was otherwise normal on direct and                         retroflexion views. Recommendation:        - Discharge patient to home.                        - Resume previous diet.                        - Continue present medications.                        - Await pathology results.                        - Repeat colonoscopy for surveillance based on                         pathology results.                        - Return to referring physician as previously                         scheduled. Procedure Code(s):     --- Professional ---                        [phone], Colonoscopy, flexible; with removal of                         tumor(s), polyp(s), or other lesion(s) by snare                          technique Diagnosis Code(s):     --- Professional ---                        K63.5, Polyp of colon                        Z86.010, Personal history of colonic polyps                        K64.0, First degree hemorrhoids                        K57.30, Diverticulosis of large intestine without                         perforation or abscess without bleeding CPT copyright 2019 American [employer] Association. All rights reserved. The codes documented in this report are preliminary and upon coder review may  be revised to meet current compliance requirements. Andrey Farmer MD, MD 06/19/2020 11:17:44 AM Number of Addenda: 0 Note Initiated On: 06/19/2020 10:54 AM Scope Withdrawal Time: 0 hours 10 minutes 51 seconds  Total Procedure Duration: 0 hours 13 minutes 42 seconds  Estimated Blood Loss:  Estimated blood loss was minimal.      [facility]

## 2020-06-19 NOTE — H&P (Signed)
Outpatient short stay form Pre-procedure 06/19/2020 10:46 AM Raylene Miyamoto MD, MPH  Primary Physician: Dr. Ellison Hughs  Reason for visit:  Surveillance colonoscopy  History of present illness:   75 y/o gentleman with history of polyps and thinks father had colon cancer in his late 72's. No abdominal surgeries. No blood thinners. No new GI symptoms.    Current Facility-Administered Medications:  .  0.9 %  sodium chloride infusion, , Intravenous, Continuous, Makenize Messman, Hilton Cork, MD, Last Rate: 20 mL/hr at 06/19/20 1029, Continued from Pre-op at 06/19/20 1029  Medications Prior to Admission  Medication Sig Dispense Refill Last Dose  . ascorbic acid (VITAMIN C) 500 MG tablet Take 500 mg by mouth daily.   Past Week at Unknown time  . aspirin 81 MG tablet Take 81 mg by mouth daily. Reported on 04/12/2015   Past Week at Unknown time  . finasteride (PROSCAR) 5 MG tablet Take 1 tablet (5 mg total) by mouth daily. 90 tablet 3 06/18/2020 at Unknown time  . Fluticasone-Salmeterol (ADVAIR) 250-50 MCG/DOSE AEPB Inhale 1 puff into the lungs 2 (two) times daily.   06/18/2020 at Unknown time  . lisinopril-hydrochlorothiazide (PRINZIDE,ZESTORETIC) 20-12.5 MG tablet Take 1 tablet by mouth daily.   06/18/2020 at Unknown time  . Multiple Vitamin (MULTIVITAMIN) tablet Take 1 tablet by mouth daily.   Past Week at Unknown time  . simvastatin (ZOCOR) 80 MG tablet Take 80 mg by mouth daily.   06/18/2020 at Unknown time  . SM OMEGA-3-6-9 FATTY ACIDS PO Take by mouth.   Past Week at Unknown time  . tamsulosin (FLOMAX) 0.4 MG CAPS capsule Take 1 capsule (0.4 mg total) by mouth daily. 30 capsule 6 06/19/2020 at Unknown time  . torsemide (DEMADEX) 10 MG tablet Take 10 mg by mouth daily.     . vitamin E 180 MG (400 UNITS) capsule Take 400 Units by mouth daily.   Past Week at Unknown time  . albuterol (PROVENTIL HFA;VENTOLIN HFA) 108 (90 BASE) MCG/ACT inhaler Inhale into the lungs every 6 (six) hours as needed for wheezing or  shortness of breath.        No Known Allergies   Past Medical History:  Diagnosis Date  . Adenomatous polyps   . Benign hypertension 01/24/2015  . Benign neoplasm of colon   . BPH with urinary obstruction   . CAD in native artery 01/24/2015  . Complicated UTI (urinary tract infection)   . COPD (chronic obstructive pulmonary disease) ([place])   . Coronary artery disease   . Hypertension   . Impaired glucose tolerance   . Pure hypercholesterolemia   . Shortness of breath dyspnea     Review of systems:  Otherwise negative.    Physical Exam  Gen: Alert, oriented. Appears stated age.  HEENT: PERRLA. Lungs: No respiratory distress CV: RRR Abd: soft, benign, no masses Ext: No edema    Planned procedures: Proceed with colonoscopy. The patient understands the nature of the planned procedure, indications, risks, alternatives and potential complications including but not limited to bleeding, infection, perforation, damage to internal organs and possible oversedation/side effects from anesthesia. The patient agrees and gives consent to proceed.  Please refer to procedure notes for findings, recommendations and patient disposition/instructions.     Raylene Miyamoto MD, MPH Gastroenterology 06/19/2020  10:46 AM

## 2020-06-19 NOTE — Interval H&P Note (Signed)
History and Physical Interval Note:  06/19/2020 10:48 AM  Brandon Brandt  has presented today for surgery, with the diagnosis of PERSONAL HX.OF COLON POLYPS.  The various methods of treatment have been discussed with the patient and family. After consideration of risks, benefits and other options for treatment, the patient has consented to  Procedure(s): COLONOSCOPY WITH PROPOFOL (N/A) as a surgical intervention.  The patient's history has been reviewed, patient examined, no change in status, stable for surgery.  I have reviewed the patient's chart and labs.  Questions were answered to the patient's satisfaction.     Lesly Rubenstein  Ok to proceed with colonoscopy

## 2020-06-19 NOTE — Anesthesia Postprocedure Evaluation (Signed)
Anesthesia Post Note  Patient: Brandon Brandt  Procedure(s) Performed: COLONOSCOPY WITH PROPOFOL (N/A )  Patient location during evaluation: Endoscopy Anesthesia Type: General Level of consciousness: awake and alert Pain management: pain level controlled Vital Signs Assessment: post-procedure vital signs reviewed and stable Respiratory status: spontaneous breathing, nonlabored ventilation, respiratory function stable and patient connected to nasal cannula oxygen Cardiovascular status: blood pressure returned to baseline and stable Postop Assessment: no apparent nausea or vomiting Anesthetic complications: no   No complications documented.   Last Vitals:  Vitals:   06/19/20 1126 06/19/20 1136  BP: 111/70 125/61  Pulse: 84 71  Resp: (!) 32 (!) 22  Temp:    SpO2: 97% 96%    Last Pain:  Vitals:   06/19/20 1136  TempSrc:   PainSc: 0-No pain                 Arita Miss

## 2020-06-19 NOTE — Anesthesia Preprocedure Evaluation (Signed)
Anesthesia Evaluation  Patient identified by MRN, date of birth, ID band Patient awake    Reviewed: Allergy & Precautions, NPO status , Patient's Chart, lab work & pertinent test results  History of Anesthesia Complications Negative for: history of anesthetic complications  Airway Mallampati: III  TM Distance: >3 FB Neck ROM: Full    Dental no notable dental hx. (+) Teeth Intact   Pulmonary neg sleep apnea, COPD,  COPD inhaler, Current Smoker and Patient abstained from smoking.,    Pulmonary exam normal breath sounds clear to auscultation       Cardiovascular Exercise Tolerance: Good METShypertension, + CAD  (-) Past MI (-) dysrhythmias  Rhythm:Regular Rate:Normal - Systolic murmurs Echo 0347 unremarkable   Neuro/Psych negative neurological ROS  negative psych ROS   GI/Hepatic neg GERD  ,(+)     (-) substance abuse  ,   Endo/Other  neg diabetes  Renal/GU negative Renal ROS     Musculoskeletal   Abdominal   Peds  Hematology   Anesthesia Other Findings Past Medical History: No date: Adenomatous polyps 01/24/2015: Benign hypertension No date: Benign neoplasm of colon No date: BPH with urinary obstruction 01/24/2015: CAD in native artery No date: Complicated UTI (urinary tract infection) No date: COPD (chronic obstructive pulmonary disease) ([facility]) No date: Coronary artery disease No date: Hypertension No date: Impaired glucose tolerance No date: Pure hypercholesterolemia No date: Shortness of breath dyspnea  Reproductive/Obstetrics                            Anesthesia Physical Anesthesia Plan  ASA: III  Anesthesia Plan: General   Post-op Pain Management:    Induction: Intravenous  PONV Risk Score and Plan: 1 and Ondansetron, Propofol infusion and TIVA  Airway Management Planned: Nasal Cannula  Additional Equipment: None  Intra-op Plan:   Post-operative Plan:    Informed Consent: I have reviewed the patients History and Physical, chart, labs and discussed the procedure including the risks, benefits and alternatives for the proposed anesthesia with the patient or authorized representative who has indicated his/her understanding and acceptance.     Dental advisory given  Plan Discussed with: CRNA and Surgeon  Anesthesia Plan Comments: (Discussed risks of anesthesia with patient, including possibility of difficulty with spontaneous ventilation under anesthesia necessitating airway intervention, PONV, and rare risks such as cardiac or respiratory or neurological events. Patient understands.)        Anesthesia Quick Evaluation

## 2020-06-19 NOTE — Progress Notes (Signed)
No risk

## 2020-06-20 ENCOUNTER — Encounter: Payer: Self-pay | Admitting: Gastroenterology

## 2020-06-20 LAB — SURGICAL PATHOLOGY

## 2020-11-24 ENCOUNTER — Ambulatory Visit: Payer: Medicare HMO | Admitting: Urology

## 2020-11-30 ENCOUNTER — Other Ambulatory Visit: Payer: Self-pay | Admitting: *Deleted

## 2020-11-30 DIAGNOSIS — R972 Elevated prostate specific antigen [PSA]: Secondary | ICD-10-CM

## 2020-12-01 ENCOUNTER — Other Ambulatory Visit: Payer: Self-pay

## 2020-12-01 ENCOUNTER — Other Ambulatory Visit
Admission: RE | Admit: 2020-12-01 | Discharge: 2020-12-01 | Disposition: A | Discharge status: Home / Self Care | Payer: Medicare HMO | Attending: Urology | Admitting: Urology

## 2020-12-01 ENCOUNTER — Ambulatory Visit: Payer: Medicare HMO | Admitting: Urology

## 2020-12-01 VITALS — BP 155/81 | HR 79 | Ht 70.0 in | Wt 225.0 lb

## 2020-12-01 DIAGNOSIS — R339 Retention of urine, unspecified: Secondary | ICD-10-CM

## 2020-12-01 DIAGNOSIS — N401 Enlarged prostate with lower urinary tract symptoms: Secondary | ICD-10-CM | POA: Diagnosis not present

## 2020-12-01 DIAGNOSIS — R3914 Feeling of incomplete bladder emptying: Secondary | ICD-10-CM | POA: Diagnosis not present

## 2020-12-01 DIAGNOSIS — Z87898 Personal history of other specified conditions: Secondary | ICD-10-CM

## 2020-12-01 DIAGNOSIS — R972 Elevated prostate specific antigen [PSA]: Secondary | ICD-10-CM | POA: Diagnosis present

## 2020-12-01 LAB — BLADDER SCAN AMB NON-IMAGING: Scan Result: 135

## 2020-12-01 LAB — PSA: Prostatic Specific Antigen: 1.27 ng/mL (ref 0.00–4.00)

## 2020-12-01 MED ORDER — TAMSULOSIN HCL 0.4 MG PO CAPS: 0.4000 mg | ORAL_CAPSULE | Freq: Every day | ORAL | 3 refills | Status: DC

## 2020-12-01 MED ORDER — FINASTERIDE 5 MG PO TABS: 5.0000 mg | ORAL_TABLET | Freq: Every day | ORAL | 3 refills | Status: DC

## 2020-12-01 NOTE — Progress Notes (Signed)
12/01/2020 4:05 PM   Brandon Brandt 1945/11/09 [id number]  Referring provider: Sofie Hartigan, MD [name],  [name] [address]  Chief Complaint  Patient presents with   Benign Prostatic Hypertrophy    HPI: 75 year old male with a personal history of BPH with LUTS and elevated PSA who returns today for routine annual follow-up.  Elevated PSA Biopsy NEGATIVE for malignancy 2/17, acute and chronic inflammation noted.  TRUS vol 177 cc.    PSA History: PSA today pending 8/20   2.45 3/20   3.87 10/19   5.08 10/25/2016  4.10 10/18/15  PSA 3.5 03/15/15     PSA 8.2  --> started on finasteride  03/08/15 PSA 9.0   Previous CT renal stone protocol 01/02/15 noting a significantly enlarged prostate, measuring 6.4 cm in transverse dimension, and 7.6 cm in craniocaudal dimension. No stones or other GU pathology.   BPH with LUTS Personal history of elevated PVRs in the 100 200 range.  PVR today is 135.  Today, his urinary symptoms are fairly well controlled.  His biggest complaint is incomplete emptying.  He gets up 3 times at night to void.  IPSS is otherwise as below.  He remains on Flomax and finasteride.   IPSS     Row Name 12/01/20 1600         International Prostate Symptom Score   How often have you had the sensation of not emptying your bladder? Almost always     How often have you had to urinate less than every two hours? Not at All     How often have you found you stopped and started again several times when you urinated? Not at All     How often have you found it difficult to postpone urination? Less than half the time     How often have you had a weak urinary stream? Less than 1 in 5 times     How often have you had to strain to start urination? Not at All     How many times did you typically get up at night to urinate? 3 Times     Total IPSS Score 11           Quality of Life due to urinary symptoms   If you were to spend the rest of your life with  your urinary condition just the way it is now how would you feel about that? Mixed              Score:  1-7 Mild 8-19 Moderate 20-35 Severe   PMH: Past Medical History:  Diagnosis Date   Adenomatous polyps    Benign hypertension 01/24/2015   Benign neoplasm of colon    BPH with urinary obstruction    CAD in native artery 60/63/0160   Complicated UTI (urinary tract infection)    COPD (chronic obstructive pulmonary disease) ([facility])    Coronary artery disease    Hypertension    Impaired glucose tolerance    Pure hypercholesterolemia    Shortness of breath dyspnea     Surgical History: Past Surgical History:  Procedure Laterality Date   CARDIAC CATHETERIZATION     COLONOSCOPY     COLONOSCOPY WITH PROPOFOL N/A 01/02/2015   Procedure: COLONOSCOPY WITH PROPOFOL;  Surgeon: Manya Silvas, MD;  Location: [facility];  Service: Endoscopy;  Laterality: N/A;   COLONOSCOPY WITH PROPOFOL N/A 06/19/2020   Procedure: COLONOSCOPY WITH PROPOFOL;  Surgeon: Lesly Rubenstein, MD;  Location:  [facility];  Service: Endoscopy;  Laterality: N/A;    Home Medications:  Allergies as of 12/01/2020   No Known Allergies      Medication List        Accurate as of December 01, 2020  4:05 PM. If you have any questions, ask your nurse or doctor.          albuterol 108 (90 Base) MCG/ACT inhaler Commonly known as: VENTOLIN HFA Inhale into the lungs every 6 (six) hours as needed for wheezing or shortness of breath.   ascorbic acid 500 MG tablet Commonly known as: VITAMIN C Take 500 mg by mouth daily.   aspirin 81 MG tablet Take 81 mg by mouth daily. Reported on 04/12/2015   finasteride 5 MG tablet Commonly known as: PROSCAR Take 1 tablet (5 mg total) by mouth daily.   Fluticasone-Salmeterol 250-50 MCG/DOSE Aepb Commonly known as: ADVAIR Inhale 1 puff into the lungs 2 (two) times daily.   lisinopril-hydrochlorothiazide 20-12.5 MG tablet Commonly known as: ZESTORETIC Take  1 tablet by mouth daily.   multivitamin tablet Take 1 tablet by mouth daily.   simvastatin 80 MG tablet Commonly known as: ZOCOR Take 1 tablet by mouth at bedtime. What changed: Another medication with the same name was removed. Continue taking this medication, and follow the directions you see here. Changed by: Hollice Espy, MD   SM OMEGA-3-6-9 FATTY ACIDS PO Take by mouth.   tamsulosin 0.4 MG Caps capsule Commonly known as: FLOMAX Take 1 capsule (0.4 mg total) by mouth daily.   torsemide 10 MG tablet Commonly known as: DEMADEX Take 10 mg by mouth daily.   vitamin E 180 MG (400 UNITS) capsule Take 400 Units by mouth daily.        Allergies: No Known Allergies  Family History: Family History  Problem Relation Age of Onset   Prostate cancer Father     Social History:  reports that he has quit smoking. His smoking use included cigarettes. He has never used smokeless tobacco. He reports that he does not drink alcohol and does not use drugs.   Physical Exam: BP (!) 155/81   Pulse 79   Ht 5\' 10"  (1.778 m)   Wt 225 lb (102.1 kg)   BMI 32.28 kg/m   Constitutional:  Alert and oriented, No acute distress. HEENT: [place] AT, moist mucus membranes.  Trachea midline, no masses. Cardiovascular: No clubbing, cyanosis, or edema. Respiratory: Normal respiratory effort, no increased work of breathing. GI: Abdomen is soft, nontender, nondistended, no abdominal masses Rectal: Normal sphincter tone.  50+ cc prostate, nontender no nodules. Skin: No rashes, bruises or suspicious lesions. Neurologic: Grossly intact, no focal deficits, moving all 4 extremities. Psychiatric: Normal mood and affect.   Assessment & Plan:    1. Benign prostatic hyperplasia with incomplete bladder emptying Stable urinary symptoms, still not interested in surgical intervention  Will continue Flomax and finasteride for maximal medical therapy-refill today  PVR slightly elevated but stably so without  sequela, will continue to monitor with PVR  - BLADDER SCAN AMB NON-IMAGING  2. History of elevated PSA PSA pending today, rectal exam updated  Will continue to follow annually unless markedly elevated today, call with results - tamsulosin (FLOMAX) 0.4 MG CAPS capsule; Take 1 capsule (0.4 mg total) by mouth daily.  Dispense: 90 capsule; Refill: 3 - finasteride (PROSCAR) 5 MG tablet; Take 1 tablet (5 mg total) by mouth daily.  Dispense: 90 tablet; Refill: 3   Follow-up 1 year with IPSS/PVR/PSA/DRE  Hollice Espy, MD  [facility] [address] [place], [place] [address] [phone]

## 2021-09-09 IMAGING — US US AORTA
1 series · 14 of 25 positions shown · non-contrast
Comparison: CT 01/02/2015

CLINICAL DATA: Screening for AAA

EXAM:
ULTRASOUND OF ABDOMINAL AORTA
TECHNIQUE: Ultrasound examination of the abdominal aorta and proximal common
iliac arteries was performed to evaluate for aneurysm. Additional
color and Doppler images of the distal aorta were obtained to
document patency.

[Series 1: us aorta · 0.36mm/px · 14 of 28 slices shown]
[im 1/28]
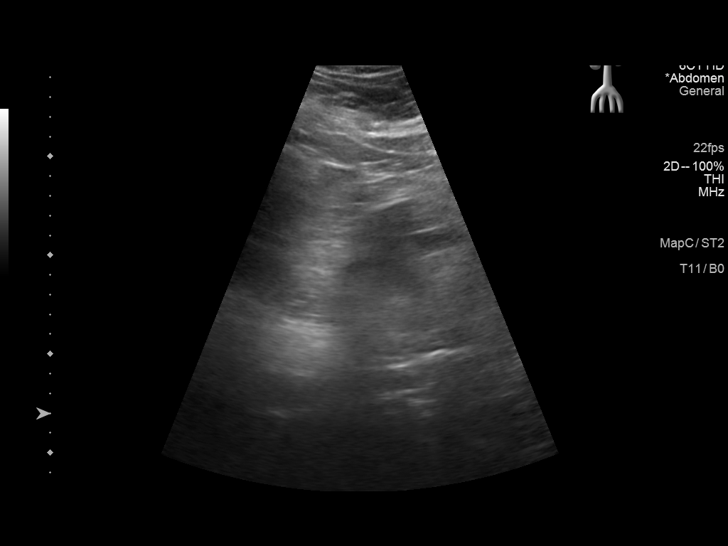
[im 3/28]
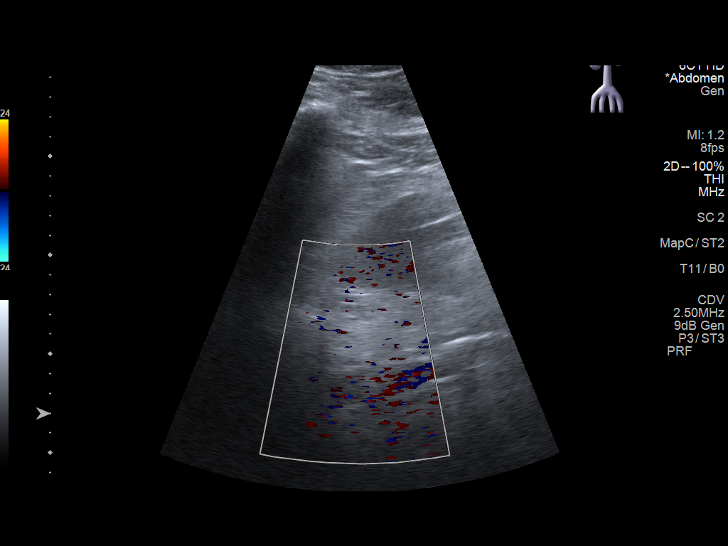
[im 5/28]
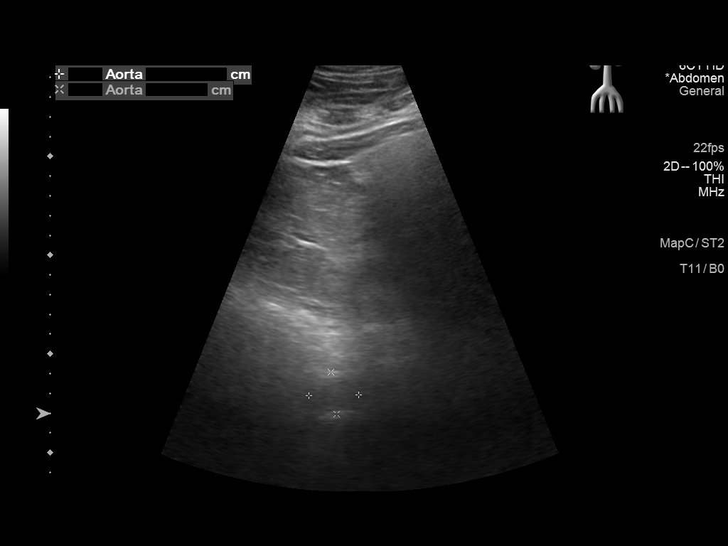
[im 7/28]
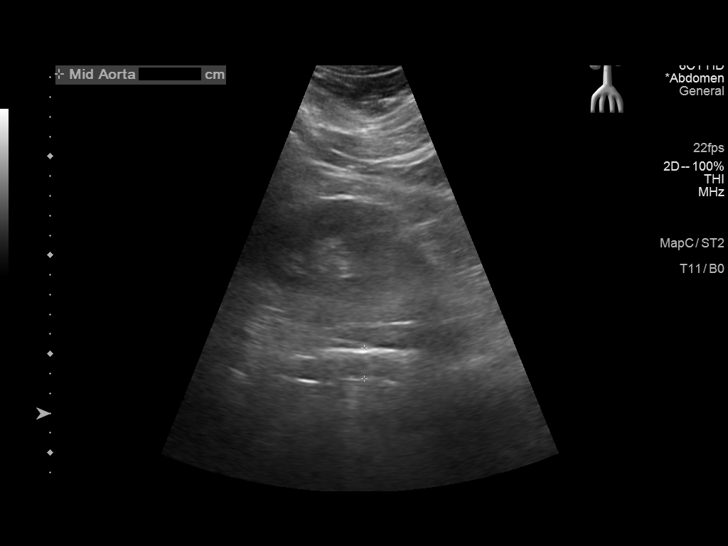
[im 10/28]
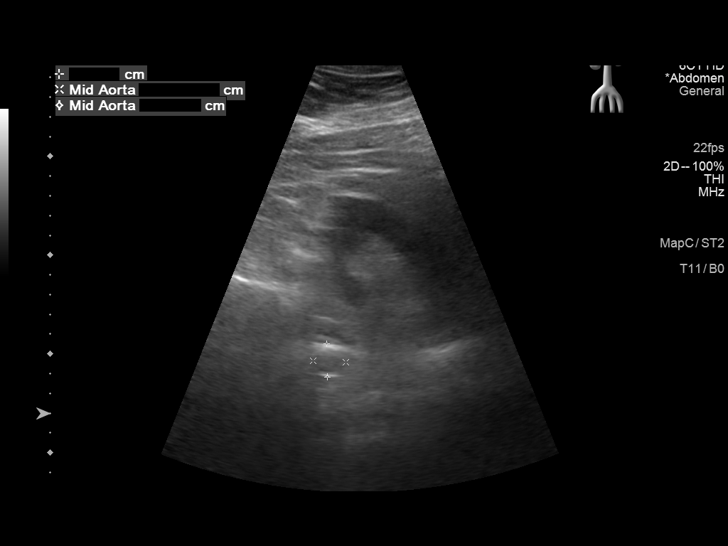
[im 11/28]
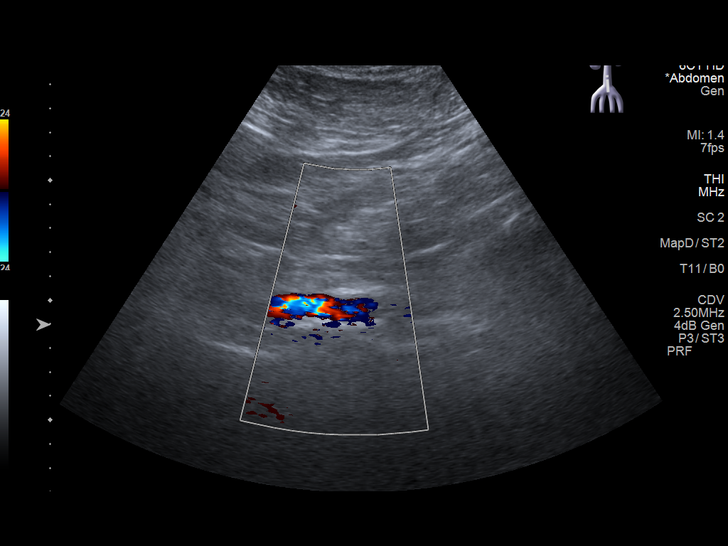
[im 13/28]
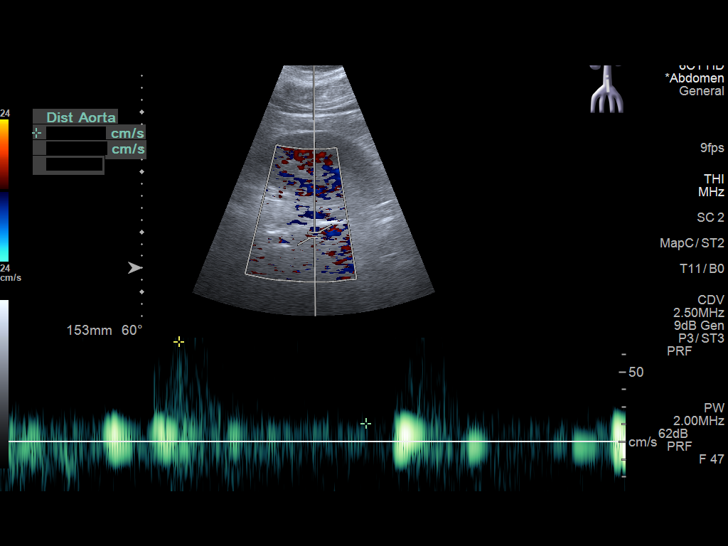
[im 15/28]
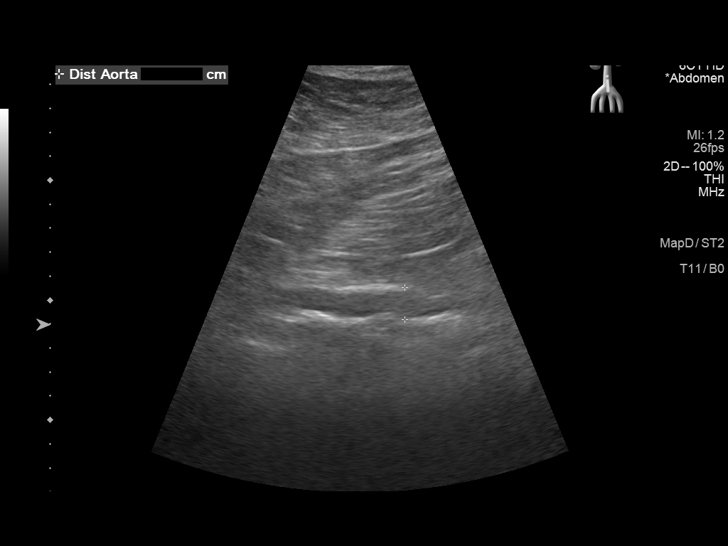
[im 17/28]
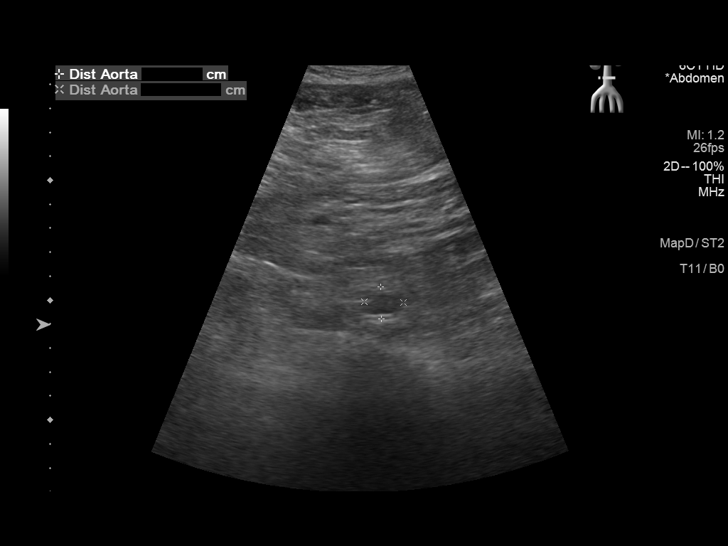
[im 19/28]
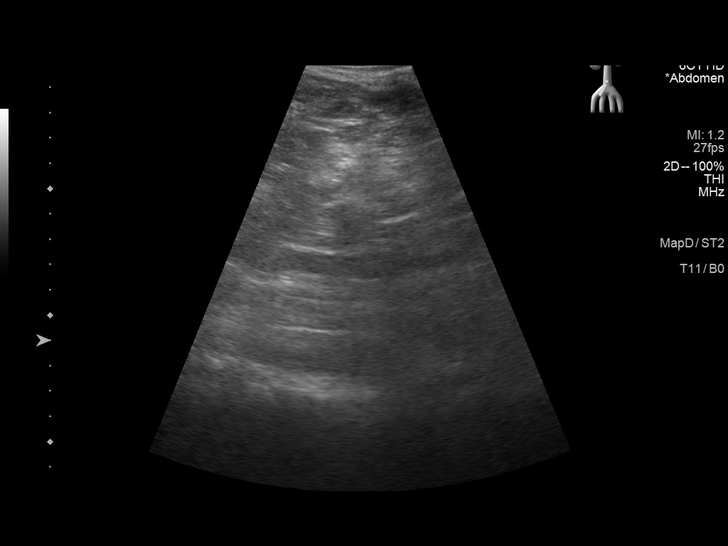
[im 21/28]
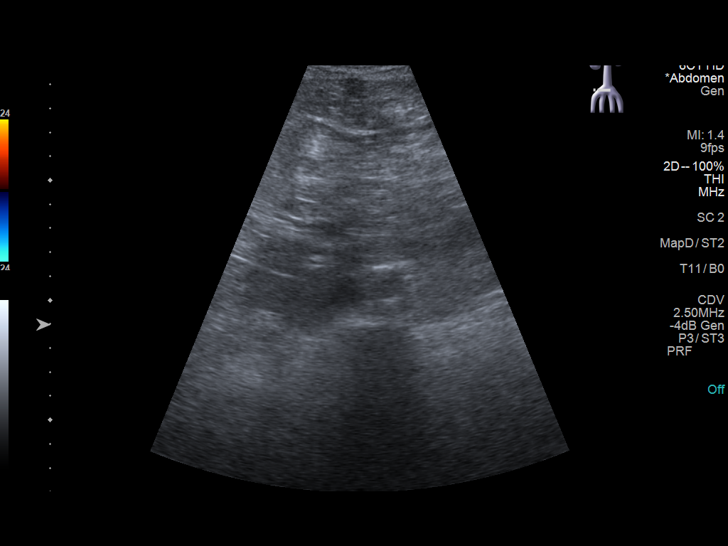
[im 23/28]
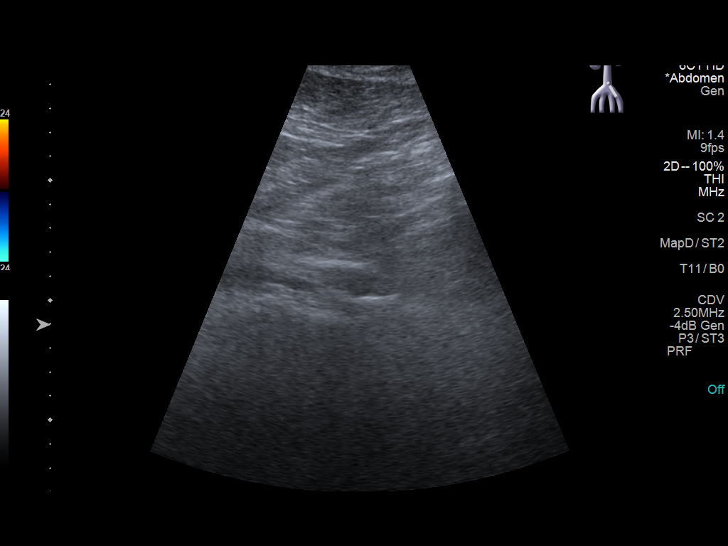
[im 25/28]
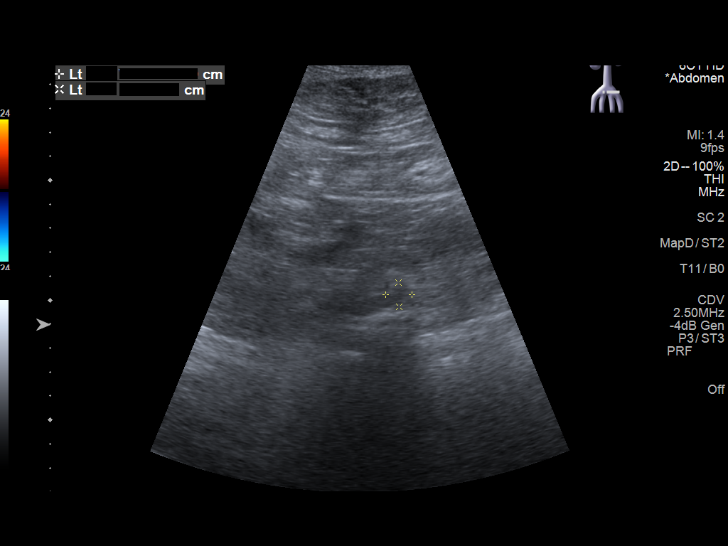
[im 28/28]
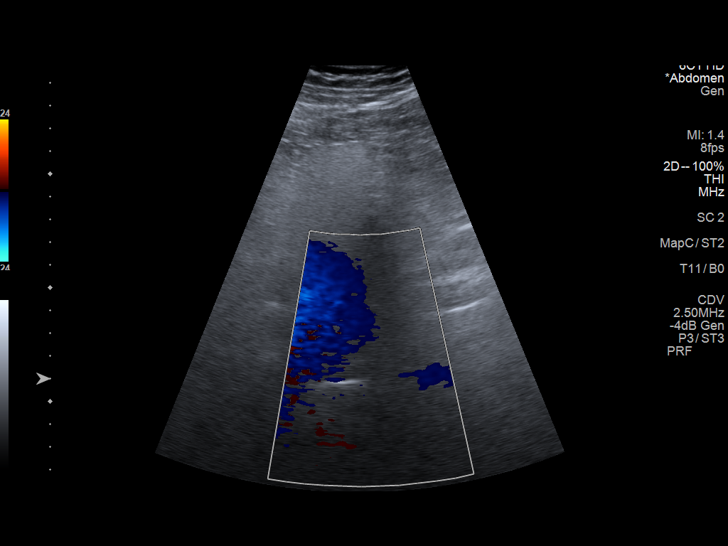

[14 of 25 positions shown; findings below may reference images not displayed]

FINDINGS: Technically limited exam secondary to poor penetration related to
patient body habitus as well as overlying bowel gas.

Abdominal aortic measurements as follows:

Proximal:  2.5 x 2.1 cm

Mid:  1.7 x 1.7 cm

Distal:  1.6 x 1.3 cm
Patent: Yes, peak systolic velocity is 72 cm/s

Right common iliac artery: 1.4 x 1.3 cm

Left common iliac artery: 1.1 x 1.0 cm
IMPRESSION: Negative for abdominal aortic aneurysm.

## 2021-12-04 ENCOUNTER — Other Ambulatory Visit: Payer: Self-pay

## 2021-12-04 DIAGNOSIS — Z87898 Personal history of other specified conditions: Secondary | ICD-10-CM

## 2021-12-04 MED ORDER — TAMSULOSIN HCL 0.4 MG PO CAPS
0.4000 mg | ORAL_CAPSULE | Freq: Every day | ORAL | 0 refills | Status: DC
Start: 2021-12-04 — End: 2022-01-08

## 2021-12-07 ENCOUNTER — Other Ambulatory Visit
Admission: RE | Admit: 2021-12-07 | Discharge: 2021-12-07 | Disposition: A | Discharge status: Home / Self Care | Payer: Medicare HMO | Attending: Urology | Admitting: Urology

## 2021-12-07 ENCOUNTER — Encounter: Payer: Self-pay | Admitting: Urology

## 2021-12-07 ENCOUNTER — Ambulatory Visit: Payer: Medicare HMO | Admitting: Urology

## 2021-12-07 VITALS — BP 129/66 | HR 88 | Ht 70.0 in | Wt 220.0 lb

## 2021-12-07 DIAGNOSIS — R3914 Feeling of incomplete bladder emptying: Secondary | ICD-10-CM | POA: Insufficient documentation

## 2021-12-07 DIAGNOSIS — N401 Enlarged prostate with lower urinary tract symptoms: Secondary | ICD-10-CM | POA: Diagnosis not present

## 2021-12-07 DIAGNOSIS — R339 Retention of urine, unspecified: Secondary | ICD-10-CM

## 2021-12-07 LAB — PSA: Prostatic Specific Antigen: 1.75 ng/mL (ref 0.00–4.00)

## 2021-12-07 LAB — BLADDER SCAN AMB NON-IMAGING

## 2021-12-07 NOTE — Progress Notes (Unsigned)
12/07/2021 3:59 PM   Brandon Brandt 04/03/1945 [id number]  Referring provider: Sofie Hartigan, MD [place],  [place] [address]  Chief Complaint  Patient presents with   Follow-up    1 year follow-up   Benign Prostatic Hypertrophy    HPI: 76 year old male with personal history of BPH/elevated PSA returns today for routine annual follow-up.  He reports today that his urinary symptoms are very stable and well controlled.  He is on finasteride and Flomax.  No side effects.  PSA today was ordered but not performed.  This will be checked after today's visit.  No dysuria or gross hematuria.  Overall doing well.  11/2020 1.27 11/2019 2.35 8/20   2.45 3/20   3.87 10/19   5.08 10/25/2016  4.10 10/18/15  PSA 3.5 03/15/15     PSA 8.2  --> started on finasteride  03/08/15 PSA 9.0  Results for orders placed or performed in visit on 12/07/21  BLADDER SCAN AMB NON-IMAGING  Result Value Ref Range   Scan Result 10m       IPSS     Row Name 12/07/21 1400         International Prostate Symptom Score   How often have you had the sensation of not emptying your bladder? Less than 1 in 5     How often have you had to urinate less than every two hours? Less than half the time     How often have you found you stopped and started again several times when you urinated? Less than 1 in 5 times     How often have you found it difficult to postpone urination? Not at All     How often have you had a weak urinary stream? Less than 1 in 5 times     How often have you had to strain to start urination? Not at All     How many times did you typically get up at night to urinate? 3 Times     Total IPSS Score 8       Quality of Life due to urinary symptoms   If you were to spend the rest of your life with your urinary condition just the way it is now how would you feel about that? Pleased              Score:  1-7 Mild 8-19 Moderate 20-35 Severe   8/20   2.45 3/20    3.87 10/19   5.08 10/25/2016  4.10 10/18/15  PSA 3.5 03/15/15     PSA 8.2  --> started on finasteride  03/08/15 PSA 9.0  PMH: Past Medical History:  Diagnosis Date   Adenomatous polyps    Benign hypertension 01/24/2015   Benign neoplasm of colon    BPH with urinary obstruction    CAD in native artery 116/03/930  Complicated UTI (urinary tract infection)    COPD (chronic obstructive pulmonary disease) (HCC)    Coronary artery disease    Hypertension    Impaired glucose tolerance    Pure hypercholesterolemia    Shortness of breath dyspnea     Surgical History: Past Surgical History:  Procedure Laterality Date   CARDIAC CATHETERIZATION     COLONOSCOPY     COLONOSCOPY WITH PROPOFOL N/A 01/02/2015   Procedure: COLONOSCOPY WITH PROPOFOL;  Surgeon: RManya Silvas MD;  Location: [facility]  Service: Endoscopy;  Laterality: N/A;   COLONOSCOPY WITH PROPOFOL N/A 06/19/2020   Procedure:  COLONOSCOPY WITH PROPOFOL;  Surgeon: Lesly Rubenstein, MD;  Location[facility] ENDOSCOPY;  Service: Endoscopy;  Laterality: N/A;    Home Medications:  Allergies as of 12/07/2021   No Known Allergies      Medication List        Accurate as of December 07, 2021 11:59 PM. If you have any questions, ask your nurse or doctor.          albuterol 108 (90 Base) MCG/ACT inhaler Commonly known as: VENTOLIN HFA Inhale into the lungs every 6 (six) hours as needed for wheezing or shortness of breath.   ascorbic acid 500 MG tablet Commonly known as: VITAMIN C Take 500 mg by mouth daily.   aspirin 81 MG tablet Take 81 mg by mouth daily. Reported on 04/12/2015   finasteride 5 MG tablet Commonly known as: PROSCAR Take 1 tablet (5 mg total) by mouth daily.   Fluticasone-Salmeterol 250-50 MCG/DOSE Aepb Commonly known as: ADVAIR Inhale 1 puff into the lungs 2 (two) times daily.   lisinopril-hydrochlorothiazide 20-12.5 MG tablet Commonly known as: ZESTORETIC Take 1 tablet by mouth daily.    multivitamin tablet Take 1 tablet by mouth daily.   simvastatin 80 MG tablet Commonly known as: ZOCOR Take 1 tablet by mouth at bedtime.   SM OMEGA-3-6-9 FATTY ACIDS PO Take by mouth.   tamsulosin 0.4 MG Caps capsule Commonly known as: FLOMAX Take 1 capsule (0.4 mg total) by mouth daily.   torsemide 10 MG tablet Commonly known as: DEMADEX Take 10 mg by mouth daily.   vitamin E 180 MG (400 UNITS) capsule Take 400 Units by mouth daily.        Allergies: No Known Allergies  Family History: Family History  Problem Relation Age of Onset   Prostate cancer Father     Social History:  reports that he has quit smoking. His smoking use included cigarettes. He has been exposed to tobacco smoke. He has never used smokeless tobacco. He reports that he does not drink alcohol and does not use drugs.   Physical Exam: BP 129/66   Pulse 88   Ht '5\' 10"'$  (1.778 m)   Wt 220 lb (99.8 kg)   BMI 31.57 kg/m   Constitutional:  Alert and oriented, No acute distress. HEENT: [place] AT, moist mucus membranes.  Trachea midline, no masses. Cardiovascular: No clubbing, cyanosis, or edema. Respiratory: Normal respiratory effort, no increased work of breathing. Rectal: Normal sphincter tone.  Enlarged prostate, nontender, no nodules Neurologic: Grossly intact, no focal deficits, moving all 4 extremities. Psychiatric: Normal mood and affect.  Laboratory Data: Lab Results  Component Value Date   WBC 19.0 (H) 01/19/2015   HGB 13.9 01/19/2015   HCT 41.8 01/19/2015   MCV 90.6 01/19/2015   PLT 227 01/19/2015    Lab Results  Component Value Date   CREATININE 0.64 01/19/2015     Assessment & Plan:    1. Benign prostatic hyperplasia with incomplete bladder emptying Doing well on finasteride and Flomax  PVR is actually improved this year, PSA screening was updated today.  Combination of medication seems to be working well. - BLADDER SCAN AMB NON-IMAGING   Return in about 1 year (around  12/08/2022) for IPSS/ PVR/ PSA/ DRE.  Hollice Espy, MD  [facility] [address], [place], [place] [address] [phone]

## 2021-12-10 ENCOUNTER — Telehealth: Payer: Self-pay

## 2021-12-10 NOTE — Telephone Encounter (Signed)
-----   Message from Hollice Espy, MD sent at 12/10/2021  1:30 PM EDT ----- PSA is excellent and stable, 1.75  Hollice Espy, MD

## 2021-12-10 NOTE — Telephone Encounter (Signed)
Informed patient of PSA results. 

## 2022-01-08 ENCOUNTER — Other Ambulatory Visit: Payer: Self-pay | Admitting: Urology

## 2022-01-08 DIAGNOSIS — Z87898 Personal history of other specified conditions: Secondary | ICD-10-CM

## 2022-01-10 ENCOUNTER — Encounter: Payer: Self-pay | Admitting: Ophthalmology

## 2022-01-15 NOTE — Discharge Instructions (Signed)

## 2022-01-16 ENCOUNTER — Ambulatory Visit: Payer: Medicare HMO | Admitting: Anesthesiology

## 2022-01-16 ENCOUNTER — Ambulatory Visit
Admission: RE | Admit: 2022-01-16 | Discharge: 2022-01-16 | Disposition: A | Discharge status: Home / Self Care | Payer: Medicare HMO | Source: Ambulatory Visit | Attending: Ophthalmology | Admitting: Ophthalmology

## 2022-01-16 ENCOUNTER — Encounter
Admission: RE | Disposition: A | Discharge status: Home / Self Care | Payer: Self-pay | Source: Ambulatory Visit | Attending: Ophthalmology

## 2022-01-16 ENCOUNTER — Other Ambulatory Visit: Payer: Self-pay

## 2022-01-16 DIAGNOSIS — Z87891 Personal history of nicotine dependence: Secondary | ICD-10-CM | POA: Diagnosis not present

## 2022-01-16 DIAGNOSIS — H2512 Age-related nuclear cataract, left eye: Secondary | ICD-10-CM | POA: Diagnosis present

## 2022-01-16 HISTORY — PX: CATARACT EXTRACTION W/PHACO: SHX586

## 2022-01-16 SURGERY — PHACOEMULSIFICATION, CATARACT, WITH IOL INSERTION
Anesthesia: Monitor Anesthesia Care | Site: Eye | Laterality: Left

## 2022-01-16 MED ORDER — LACTATED RINGERS IV SOLN
INTRAVENOUS | Status: DC
Start: 2022-01-16 — End: 2022-01-16

## 2022-01-16 MED ORDER — SIGHTPATH DOSE#1 BSS IO SOLN
INTRAOCULAR | Status: DC | PRN
  Administered 2022-01-16: 82 mL via OPHTHALMIC

## 2022-01-16 MED ORDER — TETRACAINE HCL 0.5 % OP SOLN
1.0000 [drp] | OPHTHALMIC | Status: DC | PRN
  Administered 2022-01-16 (×3): 1 [drp] via OPHTHALMIC

## 2022-01-16 MED ORDER — MIDAZOLAM HCL 2 MG/2ML IJ SOLN
INTRAMUSCULAR | Status: DC | PRN
  Administered 2022-01-16: 2 mg via INTRAVENOUS

## 2022-01-16 MED ORDER — SIGHTPATH DOSE#1 BSS IO SOLN
INTRAOCULAR | Status: DC | PRN
  Administered 2022-01-16: 15 mL via INTRAOCULAR

## 2022-01-16 MED ORDER — DEXMEDETOMIDINE HCL IN NACL 200 MCG/50ML IV SOLN
INTRAVENOUS | Status: DC | PRN
  Administered 2022-01-16: 8 ug via INTRAVENOUS

## 2022-01-16 MED ORDER — CEFUROXIME OPHTHALMIC INJECTION 1 MG/0.1 ML
INJECTION | OPHTHALMIC | Status: DC | PRN
  Administered 2022-01-16: .1 mL via INTRACAMERAL

## 2022-01-16 MED ORDER — BRIMONIDINE TARTRATE-TIMOLOL 0.2-0.5 % OP SOLN
OPHTHALMIC | Status: DC | PRN
  Administered 2022-01-16: 1 [drp] via OPHTHALMIC

## 2022-01-16 MED ORDER — SIGHTPATH DOSE#1 NA HYALUR & NA CHOND-NA HYALUR IO KIT
PACK | INTRAOCULAR | Status: DC | PRN
  Administered 2022-01-16: 1 via OPHTHALMIC

## 2022-01-16 MED ORDER — FENTANYL CITRATE (PF) 100 MCG/2ML IJ SOLN
INTRAMUSCULAR | Status: DC | PRN
  Administered 2022-01-16: 100 ug via INTRAVENOUS

## 2022-01-16 MED ORDER — ONDANSETRON HCL 4 MG/2ML IJ SOLN: 4.0000 mg | Freq: Once | INTRAMUSCULAR | Status: DC | PRN

## 2022-01-16 MED ORDER — SIGHTPATH DOSE#1 BSS IO SOLN
INTRAOCULAR | Status: DC | PRN
  Administered 2022-01-16: 2 mL via INTRAMUSCULAR

## 2022-01-16 MED ORDER — ARMC OPHTHALMIC DILATING DROPS
1.0000 | OPHTHALMIC | Status: DC | PRN
Start: 2022-01-16 — End: 2022-01-16
  Administered 2022-01-16 (×3): 1 via OPHTHALMIC

## 2022-01-16 SURGICAL SUPPLY — 13 items
CANNULA ANT/CHMB 27G (MISCELLANEOUS) IMPLANT
CANNULA ANT/CHMB 27GA (MISCELLANEOUS) IMPLANT
CATARACT SUITE SIGHTPATH (MISCELLANEOUS) ×1 IMPLANT
FEE CATARACT SUITE SIGHTPATH (MISCELLANEOUS) ×1 IMPLANT
GLOVE SRG 8 PF TXTR STRL LF DI (GLOVE) ×1 IMPLANT
GLOVE SURG ENC TEXT LTX SZ7.5 (GLOVE) ×1 IMPLANT
GLOVE SURG UNDER POLY LF SZ8 (GLOVE) ×1
LENS IOL TECNIS EYHANCE 22.5 (Intraocular Lens) IMPLANT
NDL FILTER BLUNT 18X1 1/2 (NEEDLE) ×1 IMPLANT
NEEDLE FILTER BLUNT 18X1 1/2 (NEEDLE) ×1 IMPLANT
RING MALYGIN 7.0 (MISCELLANEOUS) IMPLANT
SYR 3ML LL SCALE MARK (SYRINGE) ×1 IMPLANT
WATER STERILE IRR 250ML POUR (IV SOLUTION) ×1 IMPLANT

## 2022-01-16 NOTE — Anesthesia Postprocedure Evaluation (Signed)
Anesthesia Post Note  Patient: Brandon Brandt  Procedure(s) Performed: CATARACT EXTRACTION PHACO AND INTRAOCULAR LENS PLACEMENT (IOC) LEFT MALYUGIN 9.07 01:25.3 (Left: Eye)  Patient location during evaluation: PACU Anesthesia Type: MAC Level of consciousness: awake and alert Pain management: pain level controlled Vital Signs Assessment: post-procedure vital signs reviewed and stable Respiratory status: spontaneous breathing, nonlabored ventilation, respiratory function stable and patient connected to nasal cannula oxygen Cardiovascular status: stable and blood pressure returned to baseline Postop Assessment: no apparent nausea or vomiting Anesthetic complications: no   There were no known notable events for this encounter.   Last Vitals:  Vitals:   01/16/22 1009 01/16/22 1017  BP: 122/60 111/67  Pulse: 73 63  Resp: 15 20  Temp: 36.5 C   SpO2: 94% 93%    Last Pain:  Vitals:   01/16/22 1017  TempSrc:   PainSc: 0-No pain                 Arita Miss

## 2022-01-16 NOTE — Anesthesia Preprocedure Evaluation (Signed)
Anesthesia Evaluation  Patient identified by MRN, date of birth, ID band Patient awake    Reviewed: Allergy & Precautions, NPO status , Patient's Chart, lab work & pertinent test results  History of Anesthesia Complications Negative for: history of anesthetic complications  Airway Mallampati: III  TM Distance: >3 FB Neck ROM: Full    Dental no notable dental hx. (+) Teeth Intact   Pulmonary neg sleep apnea, COPD,  COPD inhaler, Current Smoker and Patient abstained from smoking.   Pulmonary exam normal breath sounds clear to auscultation       Cardiovascular Exercise Tolerance: Good METShypertension, + CAD  (-) Past MI (-) dysrhythmias  Rhythm:Regular Rate:Normal - Systolic murmurs Echo 7517 unremarkable   Neuro/Psych negative neurological ROS  negative psych ROS   GI/Hepatic ,neg GERD  ,,(+)     (-) substance abuse    Endo/Other  neg diabetes    Renal/GU negative Renal ROS     Musculoskeletal   Abdominal   Peds  Hematology   Anesthesia Other Findings Past Medical History: No date: Adenomatous polyps 01/24/2015: Benign hypertension No date: Benign neoplasm of colon No date: BPH with urinary obstruction 01/24/2015: CAD in native artery No date: Complicated UTI (urinary tract infection) No date: COPD (chronic obstructive pulmonary disease) (HCC) No date: Coronary artery disease No date: Hypertension No date: Impaired glucose tolerance No date: Pure hypercholesterolemia No date: Shortness of breath dyspnea  Reproductive/Obstetrics                              Anesthesia Physical Anesthesia Plan  ASA: 3  Anesthesia Plan: MAC   Post-op Pain Management:    Induction: Intravenous  PONV Risk Score and Plan: 0 and Midazolam and TIVA  Airway Management Planned: Nasal Cannula  Additional Equipment: None  Intra-op Plan:   Post-operative Plan:   Informed Consent: I have  reviewed the patients History and Physical, chart, labs and discussed the procedure including the risks, benefits and alternatives for the proposed anesthesia with the patient or authorized representative who has indicated his/her understanding and acceptance.     Dental advisory given  Plan Discussed with: CRNA and Surgeon  Anesthesia Plan Comments: (Explained risks of anesthesia, including PONV, and rare emergencies such as cardiac events, respiratory problems, and allergic reactions, requiring invasive intervention. Discussed the role of CRNA in patient's perioperative care. Patient understands. Patient counseled on benefits of smoking cessation, and increased perioperative risks associated with continued smoking. )         Anesthesia Quick Evaluation

## 2022-01-16 NOTE — H&P (Signed)
[facility]   Primary Care Physician:  Sofie Hartigan, MD Ophthalmologist: Dr. Leandrew Koyanagi  Pre-Procedure History & Physical: HPI:  Brandon Brandt is a 76 y.o. male here for ophthalmic surgery.   Past Medical History:  Diagnosis Date   Adenomatous polyps    Benign hypertension 01/24/2015   Benign neoplasm of colon    BPH with urinary obstruction    CAD in native artery 52/84/1324   Complicated UTI (urinary tract infection)    COPD (chronic obstructive pulmonary disease) (HCC)    Coronary artery disease    Hypertension    Impaired glucose tolerance    Pure hypercholesterolemia    Shortness of breath dyspnea     Past Surgical History:  Procedure Laterality Date   CARDIAC CATHETERIZATION     COLONOSCOPY     COLONOSCOPY WITH PROPOFOL N/A 01/02/2015   Procedure: COLONOSCOPY WITH PROPOFOL;  Surgeon: Manya Silvas, MD;  Location: [facility] ENDOSCOPY;  Service: Endoscopy;  Laterality: N/A;   COLONOSCOPY WITH PROPOFOL N/A 06/19/2020   Procedure: COLONOSCOPY WITH PROPOFOL;  Surgeon: Lesly Rubenstein, MD;  Location: [facility] ENDOSCOPY;  Service: Endoscopy;  Laterality: N/A;    Prior to Admission medications   Medication Sig Start Date End Date Taking? Authorizing Provider  albuterol (PROVENTIL HFA;VENTOLIN HFA) 108 (90 BASE) MCG/ACT inhaler Inhale into the lungs every 6 (six) hours as needed for wheezing or shortness of breath.   Yes [provider]  ascorbic acid (VITAMIN C) 500 MG tablet Take 500 mg by mouth daily.   Yes [provider]  aspirin 81 MG tablet Take 81 mg by mouth daily. Reported on 04/12/2015   Yes [provider]  finasteride (PROSCAR) 5 MG tablet Take 1 tablet (5 mg total) by mouth daily. 12/01/20  Yes Hollice Espy, MD  Fluticasone-Salmeterol (ADVAIR) 250-50 MCG/DOSE AEPB Inhale 1 puff into the lungs 2 (two) times daily.   Yes [provider]  lisinopril-hydrochlorothiazide (PRINZIDE,ZESTORETIC) 20-12.5 MG tablet  Take 1 tablet by mouth daily.   Yes [provider]  Multiple Vitamin (MULTIVITAMIN) tablet Take 1 tablet by mouth daily.   Yes [provider]  simvastatin (ZOCOR) 80 MG tablet Take 1 tablet by mouth at bedtime. 01/17/20  Yes [provider]  SM OMEGA-3-6-9 FATTY ACIDS PO Take by mouth.   Yes [provider]  tamsulosin (FLOMAX) 0.4 MG CAPS capsule TAKE 1 CAPSULE BY MOUTH EVERY DAY 01/08/22  Yes Hollice Espy, MD  torsemide (DEMADEX) 10 MG tablet Take 10 mg by mouth daily.   Yes [provider]  vitamin E 180 MG (400 UNITS) capsule Take 400 Units by mouth daily.   Yes [provider]    Allergies as of 12/11/2021   (No Known Allergies)    Family History  Problem Relation Age of Onset   Prostate cancer Father     Social History   Socioeconomic History   Marital status: Married    Spouse name: Not on file   Number of children: Not on file   Years of education: Not on file   Highest education level: Not on file  Occupational History   Not on file  Tobacco Use   Smoking status: Every Day    Packs/day: 0.50    Years: 40.00    Total pack years: 20.00    Types: Cigarettes    Passive exposure: Past   Smokeless tobacco: Never   Tobacco comments:    Started smoking around age 42.  Quit for  12 years and restarted.  Vaping Use   Vaping Use: Never used  Substance and Sexual Activity   Alcohol use: No    Alcohol/week: 0.0 standard drinks of alcohol   Drug use: No   Sexual activity: Not on file  Other Topics Concern   Not on file  Social History Narrative   Not on file   Social Determinants of Health   Financial Resource Strain: Not on file  Food Insecurity: Not on file  Transportation Needs: Not on file  Physical Activity: Not on file  Stress: Not on file  Social Connections: Not on file  Intimate Partner Violence: Not on file    Review of Systems: See HPI, otherwise negative ROS  Physical Exam: BP 128/60    Pulse 73   Temp 98.2 F (36.8 C) (Temporal)   Resp 10   Ht '5\' 11"'$  (1.803 m)   Wt 98.4 kg   SpO2 94%   BMI 30.27 kg/m  General:   Alert,  pleasant and cooperative in NAD Head:  Normocephalic and atraumatic. Lungs:  Clear to auscultation.    Heart:  Regular rate and rhythm.   Impression/Plan: Brandon Brandt is here for ophthalmic surgery.  Risks, benefits, limitations, and alternatives regarding ophthalmic surgery have been reviewed with the patient.  Questions have been answered.  All parties agreeable.   Leandrew Koyanagi, MD  01/16/2022, 9:07 AM

## 2022-01-16 NOTE — Transfer of Care (Signed)
Immediate Anesthesia Transfer of Care Note  Patient: Brandon Brandt  Procedure(s) Performed: CATARACT EXTRACTION PHACO AND INTRAOCULAR LENS PLACEMENT (IOC) LEFT MALYUGIN 9.07 01:25.3 (Left: Eye)  Patient Location: PACU  Anesthesia Type: MAC  Level of Consciousness: awake, alert  and patient cooperative  Airway and Oxygen Therapy: Patient Spontanous Breathing and Patient connected to supplemental oxygen  Post-op Assessment: Post-op Vital signs reviewed, Patient's Cardiovascular Status Stable, Respiratory Function Stable, Patent Airway and No signs of Nausea or vomiting  Post-op Vital Signs: Reviewed and stable  Complications: There were no known notable events for this encounter.

## 2022-01-16 NOTE — Op Note (Signed)
  OPERATIVE NOTE  Brandon Brandt [id number] 01/16/2022  PREOPERATIVE DIAGNOSIS:   Nuclear sclerotic cataract left eye with miotic pupil      H25.12   POSTOPERATIVE DIAGNOSIS:   Nuclear sclerotic cataract left eye with miotic pupil.     PROCEDURE:  Phacoemulsification with posterior chamber intraocular lens implantation of the left eye which required pupil stretching with the Malyugin pupil expansion device  Ultrasound time: Procedure(s): CATARACT EXTRACTION PHACO AND INTRAOCULAR LENS PLACEMENT (IOC) LEFT MALYUGIN 9.07 01:25.3 (Left)  LENS:   Implant Name Type Inv. Item Serial No. Manufacturer Lot No. LRB No. Used Action  LENS IOL TECNIS EYHANCE 22.5 - [id number] Intraocular Lens LENS IOL TECNIS EYHANCE 22.5 [id number] [employer]  Left 1 Implanted          SURGEON:  Wyonia Hough, MD   ANESTHESIA: Topical with tetracaine drops and 2% Xylocaine jelly, augmented with 1% preservative-free intracameral lidocaine.   COMPLICATIONS:  None.   DESCRIPTION OF PROCEDURE:  The patient was identified in the holding room and transported to the operating room and placed in the supine position under the operating microscope.  The left eye was identified as the operative eye and it was prepped and draped in the usual sterile ophthalmic fashion.   A 1 millimeter clear-corneal paracentesis was made at the 1:30 position.  The anterior chamber was filled with Viscoat viscoelastic.  0.5 ml of preservative-free 1% lidocaine was injected into the anterior chamber.  A 2.4 millimeter keratome was used to make a near-clear corneal incision at the 10:30 position.  A Malyugin pupil expander was then placed through the main incision and into the anterior chamber of the eye.  The edge of the iris was secured on the lip of the pupil expander and it was released, thereby expanding the pupil to approximately 7 millimeters for completion of the cataract surgery.  Additional Viscoat was placed in the anterior chamber.   A cystotome and capsulorrhexis forceps were used to make a curvilinear capsulorrhexis.   Balanced salt solution was used to hydrodissect and hydrodelineate the lens nucleus.   Phacoemulsification was used in stop and chop fashion to remove the lens, nucleus and epinucleus.  The remaining cortex was aspirated using the irrigation aspiration handpiece.  Additional Provisc was placed into the eye to distend the capsular bag for lens placement.  A lens was then injected into the capsular bag.  The pupil expanding ring was removed using a Kuglen hook and insertion device. The remaining viscoelastic was aspirated from the capsular bag and the anterior chamber.  The anterior chamber was filled with balanced salt solution to inflate to a physiologic pressure.   Wounds were hydrated with balanced salt solution.  The anterior chamber was inflated to a physiologic pressure with balanced salt solution.  No wound leaks were noted. Cefuroxime 0.1 ml of a '10mg'$ /ml solution was injected into the anterior chamber for a dose of 1 mg of intracameral antibiotic at the completion of the case.   Timolol and Brimonidine drops were applied to the eye.  The patient was taken to the recovery room in stable condition without complications of anesthesia or surgery.  [name] 01/16/2022, 10:07 AM

## 2022-01-17 ENCOUNTER — Encounter: Payer: Self-pay | Admitting: Ophthalmology

## 2022-01-17 ENCOUNTER — Other Ambulatory Visit: Payer: Self-pay

## 2022-02-04 NOTE — Discharge Instructions (Signed)

## 2022-02-06 ENCOUNTER — Ambulatory Visit: Payer: Medicare HMO | Admitting: Anesthesiology

## 2022-02-06 ENCOUNTER — Ambulatory Visit
Admission: RE | Admit: 2022-02-06 | Discharge: 2022-02-06 | Disposition: A | Discharge status: Home / Self Care | Payer: Medicare HMO | Attending: Ophthalmology | Admitting: Ophthalmology

## 2022-02-06 ENCOUNTER — Encounter: Payer: Self-pay | Admitting: Ophthalmology

## 2022-02-06 ENCOUNTER — Encounter
Admission: RE | Disposition: A | Discharge status: Home / Self Care | Payer: Self-pay | Source: Home / Self Care | Attending: Ophthalmology

## 2022-02-06 ENCOUNTER — Other Ambulatory Visit: Payer: Self-pay

## 2022-02-06 DIAGNOSIS — J449 Chronic obstructive pulmonary disease, unspecified: Secondary | ICD-10-CM | POA: Insufficient documentation

## 2022-02-06 DIAGNOSIS — H2511 Age-related nuclear cataract, right eye: Secondary | ICD-10-CM | POA: Diagnosis not present

## 2022-02-06 DIAGNOSIS — F17210 Nicotine dependence, cigarettes, uncomplicated: Secondary | ICD-10-CM | POA: Insufficient documentation

## 2022-02-06 DIAGNOSIS — I2510 Atherosclerotic heart disease of native coronary artery without angina pectoris: Secondary | ICD-10-CM | POA: Diagnosis not present

## 2022-02-06 DIAGNOSIS — I10 Essential (primary) hypertension: Secondary | ICD-10-CM | POA: Insufficient documentation

## 2022-02-06 DIAGNOSIS — H5703 Miosis: Secondary | ICD-10-CM | POA: Diagnosis not present

## 2022-02-06 DIAGNOSIS — E7800 Pure hypercholesterolemia, unspecified: Secondary | ICD-10-CM | POA: Diagnosis not present

## 2022-02-06 HISTORY — PX: CATARACT EXTRACTION W/PHACO: SHX586

## 2022-02-06 SURGERY — PHACOEMULSIFICATION, CATARACT, WITH IOL INSERTION
Anesthesia: Monitor Anesthesia Care | Site: Eye | Laterality: Right

## 2022-02-06 MED ORDER — ARMC OPHTHALMIC DILATING DROPS
1.0000 | OPHTHALMIC | Status: AC | PRN
  Administered 2022-02-06 (×3): 1 via OPHTHALMIC

## 2022-02-06 MED ORDER — LACTATED RINGERS IV SOLN: INTRAVENOUS | Status: DC

## 2022-02-06 MED ORDER — BRIMONIDINE TARTRATE-TIMOLOL 0.2-0.5 % OP SOLN
OPHTHALMIC | Status: DC | PRN
  Administered 2022-02-06: 1 [drp] via OPHTHALMIC

## 2022-02-06 MED ORDER — ONDANSETRON HCL 4 MG/2ML IJ SOLN: 4.0000 mg | Freq: Once | INTRAMUSCULAR | Status: DC | PRN

## 2022-02-06 MED ORDER — SIGHTPATH DOSE#1 BSS IO SOLN
INTRAOCULAR | Status: DC | PRN
  Administered 2022-02-06: 15 mL

## 2022-02-06 MED ORDER — SIGHTPATH DOSE#1 NA HYALUR & NA CHOND-NA HYALUR IO KIT
PACK | INTRAOCULAR | Status: DC | PRN
  Administered 2022-02-06: 1 via OPHTHALMIC

## 2022-02-06 MED ORDER — CEFUROXIME OPHTHALMIC INJECTION 1 MG/0.1 ML
INJECTION | OPHTHALMIC | Status: DC | PRN
  Administered 2022-02-06: .1 mL via INTRACAMERAL

## 2022-02-06 MED ORDER — FENTANYL CITRATE (PF) 100 MCG/2ML IJ SOLN
INTRAMUSCULAR | Status: DC | PRN
  Administered 2022-02-06: 50 ug via INTRAVENOUS

## 2022-02-06 MED ORDER — TETRACAINE HCL 0.5 % OP SOLN
1.0000 [drp] | OPHTHALMIC | Status: AC | PRN
  Administered 2022-02-06 (×3): 1 [drp] via OPHTHALMIC

## 2022-02-06 MED ORDER — SIGHTPATH DOSE#1 BSS IO SOLN
INTRAOCULAR | Status: DC | PRN
  Administered 2022-02-06: 1 mL via INTRAMUSCULAR

## 2022-02-06 MED ORDER — SIGHTPATH DOSE#1 BSS IO SOLN
INTRAOCULAR | Status: DC | PRN
  Administered 2022-02-06: 47 mL via OPHTHALMIC

## 2022-02-06 SURGICAL SUPPLY — 11 items
CATARACT SUITE SIGHTPATH (MISCELLANEOUS) ×1 IMPLANT
FEE CATARACT SUITE SIGHTPATH (MISCELLANEOUS) ×1 IMPLANT
GLOVE SRG 8 PF TXTR STRL LF DI (GLOVE) ×1 IMPLANT
GLOVE SURG ENC TEXT LTX SZ7.5 (GLOVE) ×1 IMPLANT
GLOVE SURG UNDER POLY LF SZ8 (GLOVE) ×1
LENS IOL TECNIS EYHANCE 22.5 (Intraocular Lens) IMPLANT
NDL FILTER BLUNT 18X1 1/2 (NEEDLE) ×1 IMPLANT
NEEDLE FILTER BLUNT 18X1 1/2 (NEEDLE) ×1 IMPLANT
RING MALYGIN 7.0 (MISCELLANEOUS) IMPLANT
SYR 3ML LL SCALE MARK (SYRINGE) ×1 IMPLANT
WATER STERILE IRR 250ML POUR (IV SOLUTION) ×1 IMPLANT

## 2022-02-06 NOTE — Anesthesia Postprocedure Evaluation (Signed)
Anesthesia Post Note  Patient: Brandon Brandt  Procedure(s) Performed: CATARACT EXTRACTION PHACO AND INTRAOCULAR LENS PLACEMENT (IOC) RIGHT MALYUGIN  10.55  00:59.0 (Right: Eye)  Patient location during evaluation: PACU Anesthesia Type: MAC Level of consciousness: awake and alert Pain management: pain level controlled Vital Signs Assessment: post-procedure vital signs reviewed and stable Respiratory status: spontaneous breathing, nonlabored ventilation, respiratory function stable and patient connected to nasal cannula oxygen Cardiovascular status: stable and blood pressure returned to baseline Postop Assessment: no apparent nausea or vomiting Anesthetic complications: no   There were no known notable events for this encounter.   Last Vitals:  Vitals:   02/06/22 1347 02/06/22 1352  BP: (!) 155/51 133/68  Pulse: 69 71  Resp: 16 17  Temp: 36.6 C 36.6 C  SpO2: 100% 97%    Last Pain:  Vitals:   02/06/22 1352  TempSrc:   PainSc: 0-No pain                 Arita Miss

## 2022-02-06 NOTE — H&P (Signed)
[facility]   Primary Care Physician:  Sofie Hartigan, MD Ophthalmologist: Dr. Leandrew Koyanagi  Pre-Procedure History & Physical: HPI:  KASHAUN BEBO is a 76 y.o. male here for ophthalmic surgery.   Past Medical History:  Diagnosis Date   Adenomatous polyps    Benign hypertension 01/24/2015   Benign neoplasm of colon    BPH with urinary obstruction    CAD in native artery 33/29/5188   Complicated UTI (urinary tract infection)    COPD (chronic obstructive pulmonary disease) (HCC)    Coronary artery disease    Hypertension    Impaired glucose tolerance    Pure hypercholesterolemia    Shortness of breath dyspnea     Past Surgical History:  Procedure Laterality Date   CARDIAC CATHETERIZATION     CATARACT EXTRACTION W/PHACO Left 01/16/2022   Procedure: CATARACT EXTRACTION PHACO AND INTRAOCULAR LENS PLACEMENT (Conception) LEFT MALYUGIN 9.07 01:25.3;  Surgeon: Leandrew Koyanagi, MD;  Location: [place];  Service: Ophthalmology;  Laterality: Left;   COLONOSCOPY     COLONOSCOPY WITH PROPOFOL N/A 01/02/2015   Procedure: COLONOSCOPY WITH PROPOFOL;  Surgeon: Manya Silvas, MD;  Location: [facility] ENDOSCOPY;  Service: Endoscopy;  Laterality: N/A;   COLONOSCOPY WITH PROPOFOL N/A 06/19/2020   Procedure: COLONOSCOPY WITH PROPOFOL;  Surgeon: Lesly Rubenstein, MD;  Location: [facility] ENDOSCOPY;  Service: Endoscopy;  Laterality: N/A;    Prior to Admission medications   Medication Sig Start Date End Date Taking? Authorizing Provider  albuterol (PROVENTIL HFA;VENTOLIN HFA) 108 (90 BASE) MCG/ACT inhaler Inhale into the lungs every 6 (six) hours as needed for wheezing or shortness of breath.   Yes [provider]  ascorbic acid (VITAMIN C) 500 MG tablet Take 500 mg by mouth daily.   Yes [provider]  aspirin 81 MG tablet Take 81 mg by mouth daily. Reported on 04/12/2015   Yes [provider]  finasteride (PROSCAR) 5 MG tablet Take 1 tablet (5 mg  total) by mouth daily. 12/01/20  Yes Hollice Espy, MD  Fluticasone-Salmeterol (ADVAIR) 250-50 MCG/DOSE AEPB Inhale 1 puff into the lungs 2 (two) times daily.   Yes [provider]  lisinopril-hydrochlorothiazide (PRINZIDE,ZESTORETIC) 20-12.5 MG tablet Take 1 tablet by mouth daily.   Yes [provider]  Multiple Vitamin (MULTIVITAMIN) tablet Take 1 tablet by mouth daily.   Yes [provider]  simvastatin (ZOCOR) 80 MG tablet Take 1 tablet by mouth at bedtime. 01/17/20  Yes [provider]  SM OMEGA-3-6-9 FATTY ACIDS PO Take by mouth.   Yes [provider]  tamsulosin (FLOMAX) 0.4 MG CAPS capsule TAKE 1 CAPSULE BY MOUTH EVERY DAY 01/08/22  Yes Hollice Espy, MD  torsemide (DEMADEX) 10 MG tablet Take 10 mg by mouth daily.   Yes [provider]  vitamin E 180 MG (400 UNITS) capsule Take 400 Units by mouth daily.   Yes [provider]    Allergies as of 12/11/2021   (No Known Allergies)    Family History  Problem Relation Age of Onset   Prostate cancer Father     Social History   Socioeconomic History   Marital status: Married    Spouse name: Not on file   Number of children: Not on file   Years of education: Not on file   Highest education level: Not on file  Occupational History   Not on file  Tobacco Use   Smoking status: Every Day    Packs/day: 0.50    Years: 40.00  Total pack years: 20.00    Types: Cigarettes    Passive exposure: Past   Smokeless tobacco: Never   Tobacco comments:    Started smoking around age 61.  Quit for 12 years and restarted.  Vaping Use   Vaping Use: Never used  Substance and Sexual Activity   Alcohol use: No    Alcohol/week: 0.0 standard drinks of alcohol   Drug use: No   Sexual activity: Not on file  Other Topics Concern   Not on file  Social History Narrative   Not on file   Social Determinants of Health   Financial Resource Strain: Not on file  Food Insecurity: Not  on file  Transportation Needs: Not on file  Physical Activity: Not on file  Stress: Not on file  Social Connections: Not on file  Intimate Partner Violence: Not on file    Review of Systems: See HPI, otherwise negative ROS  Physical Exam: BP 122/64   Pulse 82   Temp 97.8 F (36.6 C) (Temporal)   Resp 20   Ht '5\' 11"'$  (1.803 m)   Wt 98.9 kg   SpO2 97%   BMI 30.40 kg/m  General:   Alert,  pleasant and cooperative in NAD Head:  Normocephalic and atraumatic. Lungs:  Clear to auscultation.    Heart:  Regular rate and rhythm.   Impression/Plan: [name] is here for ophthalmic surgery.  Risks, benefits, limitations, and alternatives regarding ophthalmic surgery have been reviewed with the patient.  Questions have been answered.  All parties agreeable.   Leandrew Koyanagi, MD  02/06/2022, 12:49 PM

## 2022-02-06 NOTE — Transfer of Care (Signed)
Immediate Anesthesia Transfer of Care Note  Patient: Brandon Brandt  Procedure(s) Performed: CATARACT EXTRACTION PHACO AND INTRAOCULAR LENS PLACEMENT (IOC) RIGHT MALYUGIN  10.55  00:59.0 (Right: Eye)  Patient Location: PACU  Anesthesia Type: MAC  Level of Consciousness: awake, alert  and patient cooperative  Airway and Oxygen Therapy: Patient Spontanous Breathing and Patient connected to supplemental oxygen  Post-op Assessment: Post-op Vital signs reviewed, Patient's Cardiovascular Status Stable, Respiratory Function Stable, Patent Airway and No signs of Nausea or vomiting  Post-op Vital Signs: Reviewed and stable  Complications: No notable events documented.

## 2022-02-06 NOTE — Anesthesia Preprocedure Evaluation (Signed)
Anesthesia Evaluation  Patient identified by MRN, date of birth, ID band Patient awake    Reviewed: Allergy & Precautions, NPO status , Patient's Chart, lab work & pertinent test results  History of Anesthesia Complications Negative for: history of anesthetic complications  Airway Mallampati: III  TM Distance: >3 FB Neck ROM: Full    Dental no notable dental hx. (+) Teeth Intact   Pulmonary neg sleep apnea, COPD,  COPD inhaler, Current Smoker and Patient abstained from smoking.   Pulmonary exam normal breath sounds clear to auscultation       Cardiovascular Exercise Tolerance: Good METShypertension, + CAD  (-) Past MI (-) dysrhythmias  Rhythm:Regular Rate:Normal - Systolic murmurs Echo 4166 unremarkable   Neuro/Psych negative neurological ROS  negative psych ROS   GI/Hepatic ,neg GERD  ,,(+)     (-) substance abuse    Endo/Other  neg diabetes    Renal/GU negative Renal ROS     Musculoskeletal   Abdominal   Peds  Hematology   Anesthesia Other Findings Past Medical History: No date: Adenomatous polyps 01/24/2015: Benign hypertension No date: Benign neoplasm of colon No date: BPH with urinary obstruction 01/24/2015: CAD in native artery No date: Complicated UTI (urinary tract infection) No date: COPD (chronic obstructive pulmonary disease) (HCC) No date: Coronary artery disease No date: Hypertension No date: Impaired glucose tolerance No date: Pure hypercholesterolemia No date: Shortness of breath dyspnea  Reproductive/Obstetrics                              Anesthesia Physical Anesthesia Plan  ASA: 3  Anesthesia Plan: MAC   Post-op Pain Management:    Induction: Intravenous  PONV Risk Score and Plan: 0 and Midazolam and TIVA  Airway Management Planned: Nasal Cannula  Additional Equipment: None  Intra-op Plan:   Post-operative Plan:   Informed Consent: I have  reviewed the patients History and Physical, chart, labs and discussed the procedure including the risks, benefits and alternatives for the proposed anesthesia with the patient or authorized representative who has indicated his/her understanding and acceptance.     Dental advisory given  Plan Discussed with: CRNA and Surgeon  Anesthesia Plan Comments: (Explained risks of anesthesia, including PONV, and rare emergencies such as cardiac events, respiratory problems, and allergic reactions, requiring invasive intervention. Discussed the role of CRNA in patient's perioperative care. Patient understands. Patient counseled on benefits of smoking cessation, and increased perioperative risks associated with continued smoking. )         Anesthesia Quick Evaluation

## 2022-02-06 NOTE — Op Note (Signed)
  OPERATIVE NOTE  Brandon Brandt [id number] 02/06/2022   PREOPERATIVE DIAGNOSIS:    Nuclear Sclerotic Cataract Right eye with miotic pupil.        H25.11  POSTOPERATIVE DIAGNOSIS: Nuclear Sclerotic Cataract Right eye with miotic pupil.          PROCEDURE:  Phacoemusification with posterior chamber intraocular lens placement of the right eye which required pupil stretching with the Malyugin pupil expansion device. Ultrasound time: Procedure(s): CATARACT EXTRACTION PHACO AND INTRAOCULAR LENS PLACEMENT (IOC) RIGHT MALYUGIN  10.55  00:59.0 (Right)  LENS:   Implant Name Type Inv. Item Serial No. Manufacturer Lot No. LRB No. Used Action  LENS IOL TECNIS EYHANCE 22.5 - [id number] Intraocular Lens LENS IOL TECNIS EYHANCE 22.5 [id number] [employer]  Right 1 Implanted       SURGEON:  Wyonia Hough, MD   ANESTHESIA:  Topical with tetracaine drops and 2% Xylocaine jelly, augmented with 1% preservative-free intracameral lidocaine.   COMPLICATIONS:  None.   DESCRIPTION OF PROCEDURE:  The patient was identified in the holding room and transported to the operating room and placed in the supine position under the operating microscope. Theright eye was identified as the operative eye and it was prepped and draped in the usual sterile ophthalmic fashion.   A 1 millimeter clear-corneal paracentesis was made at the 12:00 position.  0.5 ml of preservative-free 1% lidocaine was injected into the anterior chamber. The anterior chamber was filled with Viscoat viscoelastic.  A 2.4 millimeter keratome was used to make a near-clear corneal incision at the 9:00 position. A Malyugin pupil expander was then placed through the main incision and into the anterior chamber of the eye.  The edge of the iris was secured on the lip of the pupil expander and it was released, thereby expanding the pupil to approximately 7 millimeters for completion of the cataract surgery.  Additional Viscoat was placed in the  anterior chamber.  A cystotome and capsulorrhexis forceps were used to make a curvilinear capsulorrhexis.   Balanced salt solution was used to hydrodissect and hydrodelineate the lens nucleus.   Phacoemulsification was used in stop and chop fashion to remove the lens, nucleus and epinucleus.  The remaining cortex was aspirated using the irrigation aspiration handpiece.  Additional Provisc was placed into the eye to distend the capsular bag for lens placement.  A lens was then injected into the capsular bag.  The pupil expanding ring was removed using a Kuglen hook and insertion device. The remaining viscoelastic was aspirated from the capsular bag and the anterior chamber.  The anterior chamber was filled with balanced salt solution to inflate to a physiologic pressure.  Wounds were hydrated with balanced salt solution.  The anterior chamber was inflated to a physiologic pressure with balanced salt solution.  No wound leaks were noted.Cefuroxime 0.1 ml of a '10mg'$ /ml solution was injected into the anterior chamber for a dose of 1 mg of intracameral antibiotic at the completion of the case. Timolol and Brimonidine drops were applied to the eye.  The patient was taken to the recovery room in stable condition without complications of anesthesia or surgery.  [name] 02/06/2022, 1:45 PM

## 2022-02-07 ENCOUNTER — Encounter: Payer: Self-pay | Admitting: Ophthalmology

## 2022-02-12 ENCOUNTER — Other Ambulatory Visit: Payer: Self-pay | Admitting: Urology

## 2022-02-12 DIAGNOSIS — Z87898 Personal history of other specified conditions: Secondary | ICD-10-CM

## 2022-12-13 ENCOUNTER — Ambulatory Visit: Payer: Medicare HMO | Admitting: Urology

## 2023-01-03 ENCOUNTER — Ambulatory Visit: Payer: Medicare HMO | Admitting: Urology

## 2023-01-20 ENCOUNTER — Other Ambulatory Visit: Payer: Self-pay | Admitting: Urology

## 2023-01-20 DIAGNOSIS — Z87898 Personal history of other specified conditions: Secondary | ICD-10-CM

## 2023-02-01 ENCOUNTER — Other Ambulatory Visit: Payer: Self-pay | Admitting: Urology

## 2023-02-01 DIAGNOSIS — Z87898 Personal history of other specified conditions: Secondary | ICD-10-CM

## 2023-02-10 ENCOUNTER — Other Ambulatory Visit: Payer: Self-pay

## 2023-02-10 ENCOUNTER — Other Ambulatory Visit
Admission: RE | Admit: 2023-02-10 | Discharge: 2023-02-10 | Disposition: A | Discharge status: Home / Self Care | Payer: Medicare HMO | Attending: Urology | Admitting: Urology

## 2023-02-10 DIAGNOSIS — N401 Enlarged prostate with lower urinary tract symptoms: Secondary | ICD-10-CM

## 2023-02-10 DIAGNOSIS — R3914 Feeling of incomplete bladder emptying: Secondary | ICD-10-CM | POA: Diagnosis present

## 2023-02-10 LAB — PSA: Prostatic Specific Antigen: 2.46 ng/mL (ref 0.00–4.00)

## 2023-02-14 ENCOUNTER — Ambulatory Visit: Payer: Medicare HMO | Admitting: Urology

## 2023-02-14 VITALS — BP 145/76 | HR 92 | Ht 71.0 in | Wt 219.0 lb

## 2023-02-14 DIAGNOSIS — N401 Enlarged prostate with lower urinary tract symptoms: Secondary | ICD-10-CM | POA: Diagnosis not present

## 2023-02-14 DIAGNOSIS — R972 Elevated prostate specific antigen [PSA]: Secondary | ICD-10-CM

## 2023-02-14 DIAGNOSIS — R339 Retention of urine, unspecified: Secondary | ICD-10-CM

## 2023-02-14 LAB — BLADDER SCAN AMB NON-IMAGING: Scan Result: 186

## 2023-02-14 NOTE — Progress Notes (Signed)
Marcelle Overlie Plume,acting as a scribe for Vanna Scotland, MD.,have documented all relevant documentation on the behalf of Vanna Scotland, MD,as directed by  Vanna Scotland, MD while in the presence of Vanna Scotland, MD.  02/14/23 10:39 AM   Brandon Brandt 1946/01/08 [id number]  Referring provider: Marina Goodell, MD [address] [place],  [place] [address]  Chief Complaint  Patient presents with   Benign Prostatic Hypertrophy    HPI: 77 year-old male who presents today for a follow up of BPH and history of elevated PSA.   His TRUS volume in the past was 177 at the time of prostate biopsy back in 2017, which was negative.  He has a personal history of BPH and is managed on Flomax and finasteride.   His most recent PSA on 02/10/2023 is up a little this year at 2.46.   Today, his PVR is elevated at 186 mL. His PVR's have always been in the 100-200 range. He is doing really well from a urinary standpoint. He acknowledges not fully emptying the bladder but states it does not bother him much.     Prostatic Specific Antigen  Latest Ref Rng 0.00 - 4.00 ng/mL  03/08/2015 9.0  03/15/2015 8.2 (started on finasteride)  10/18/2015 3.5  10/25/2016 4.10  12/2017 5.08  05/11/2018 3.87   11/05/2018 2.45   11/30/2019 2.35   12/01/2020 1.27   12/07/2021 1.75   02/10/2023 2.46    Results for orders placed or performed in visit on 02/14/23  Bladder Scan (Post Void Residual) in office  Result Value Ref Range   Scan Result 186 ml   '  IPSS     Row Name 02/14/23 0900         International Prostate Symptom Score   How often have you had the sensation of not emptying your bladder? Less than 1 in 5     How often have you had to urinate less than every two hours? Less than 1 in 5 times     How often have you found you stopped and started again several times when you urinated? Not at All     How often have you found it difficult to postpone urination? Less than half the time     How often  have you had a weak urinary stream? Not at All     How often have you had to strain to start urination? Not at All     How many times did you typically get up at night to urinate? 2 Times     Total IPSS Score 6       Quality of Life due to urinary symptoms   If you were to spend the rest of your life with your urinary condition just the way it is now how would you feel about that? Pleased              Score:  1-7 Mild 8-19 Moderate 20-35 Severe   PMH: Past Medical History:  Diagnosis Date   Adenomatous polyps    Benign hypertension 01/24/2015   Benign neoplasm of colon    BPH with urinary obstruction    CAD in native artery 01/24/2015   Complicated UTI (urinary tract infection)    COPD (chronic obstructive pulmonary disease) (HCC)    Coronary artery disease    Hypertension    Impaired glucose tolerance    Pure hypercholesterolemia    Shortness of breath dyspnea     Surgical History:  Past Surgical History:  Procedure Laterality Date   CARDIAC CATHETERIZATION     CATARACT EXTRACTION W/PHACO Left 01/16/2022   Procedure: CATARACT EXTRACTION PHACO AND INTRAOCULAR LENS PLACEMENT (IOC) LEFT MALYUGIN 9.07 01:25.3;  Surgeon: Lockie Mola, MD;  Location: [facility];  Service: Ophthalmology;  Laterality: Left;   CATARACT EXTRACTION W/PHACO Right 02/06/2022   Procedure: CATARACT EXTRACTION PHACO AND INTRAOCULAR LENS PLACEMENT (IOC) RIGHT MALYUGIN  10.55  00:59.0;  Surgeon: Lockie Mola, MD;  Location: [facility];  Service: Ophthalmology;  Laterality: Right;   COLONOSCOPY     COLONOSCOPY WITH PROPOFOL N/A 01/02/2015   Procedure: COLONOSCOPY WITH PROPOFOL;  Surgeon: Scot Jun, MD;  Location: [facility] ENDOSCOPY;  Service: Endoscopy;  Laterality: N/A;   COLONOSCOPY WITH PROPOFOL N/A 06/19/2020   Procedure: COLONOSCOPY WITH PROPOFOL;  Surgeon: Regis Bill, MD;  Location: [facility] ENDOSCOPY;  Service: Endoscopy;  Laterality: N/A;    Home  Medications:  Allergies as of 02/14/2023   No Known Allergies      Medication List        Accurate as of February 14, 2023 10:39 AM. If you have any questions, ask your nurse or doctor.          albuterol 108 (90 Base) MCG/ACT inhaler Commonly known as: VENTOLIN HFA Inhale into the lungs every 6 (six) hours as needed for wheezing or shortness of breath.   ascorbic acid 500 MG tablet Commonly known as: VITAMIN C Take 500 mg by mouth daily.   aspirin 81 MG tablet Take 81 mg by mouth daily. Reported on 04/12/2015   finasteride 5 MG tablet Commonly known as: PROSCAR TAKE 1 TABLET (5 MG TOTAL) BY MOUTH DAILY.   Fluticasone-Salmeterol 250-50 MCG/DOSE Aepb Commonly known as: ADVAIR Inhale 1 puff into the lungs 2 (two) times daily.   lisinopril-hydrochlorothiazide 20-12.5 MG tablet Commonly known as: ZESTORETIC Take 1 tablet by mouth daily.   multivitamin tablet Take 1 tablet by mouth daily.   simvastatin 80 MG tablet Commonly known as: ZOCOR Take 1 tablet by mouth at bedtime.   SM OMEGA-3-6-9 FATTY ACIDS PO Take by mouth.   tamsulosin 0.4 MG Caps capsule Commonly known as: FLOMAX TAKE 1 CAPSULE BY MOUTH EVERY DAY   torsemide 10 MG tablet Commonly known as: DEMADEX Take 10 mg by mouth daily.   vitamin E 180 MG (400 UNITS) capsule Take 400 Units by mouth daily.        Family History: Family History  Problem Relation Age of Onset   Prostate cancer Father     Social History:  reports that he has been smoking cigarettes. He has a 20 pack-year smoking history. He has been exposed to tobacco smoke. He has never used smokeless tobacco. He reports that he does not drink alcohol and does not use drugs.   Physical Exam: BP (!) 145/76   Pulse 92   Ht 5\' 11"  (1.803 m)   Wt 219 lb (99.3 kg)   BMI 30.54 kg/m   Constitutional:  Alert and oriented, No acute distress. HEENT: [place] AT, moist mucus membranes.  Trachea midline, no masses. Neurologic: Grossly intact, no  focal deficits, moving all 4 extremities. Psychiatric: Normal mood and affect.   Assessment & Plan:    1. BPH with incomplete bladder emptying - Continue finasteride and tamsulosin. - Monitor urinary symptoms and PVR at future visits. - Incomplete emptying but stable, minimal bother  2. Elevated PSA - PSA level has increased slightly to 2.46 ng/mL this year. Given the  size of the prostate and previous negative biopsy in 2017, the PSA level is considered low. - Continue annual PSA monitoring. - No rectal exam is deemed necessary this year due to stable PSA levels and patient age.  Return in about 1 year (around 02/14/2024) for repeat PSA and medication refill.  I have reviewed the above documentation for accuracy and completeness, and I agree with the above.   Vanna Scotland, MD   [facility] [address], [address] [place], [place] [address] [phone]

## 2023-05-02 ENCOUNTER — Other Ambulatory Visit: Payer: Self-pay | Admitting: Urology

## 2023-05-02 DIAGNOSIS — Z87898 Personal history of other specified conditions: Secondary | ICD-10-CM

## 2023-05-05 ENCOUNTER — Telehealth: Payer: Self-pay | Admitting: Urology

## 2023-05-05 DIAGNOSIS — Z87898 Personal history of other specified conditions: Secondary | ICD-10-CM

## 2023-05-05 MED ORDER — TAMSULOSIN HCL 0.4 MG PO CAPS: 0.4000 mg | ORAL_CAPSULE | Freq: Every day | ORAL | 2 refills | Status: DC

## 2023-05-05 MED ORDER — FINASTERIDE 5 MG PO TABS: 5.0000 mg | ORAL_TABLET | Freq: Every day | ORAL | 0 refills | Status: DC

## 2023-05-05 NOTE — Telephone Encounter (Signed)
 Refills for Finasteride and Tamsulosin sent to [facility]

## 2023-05-05 NOTE — Telephone Encounter (Signed)
 Patient called requesting for all Medications to be sent to [facility] in [place] as he has changed his insurance to [facility]. Please advise patient.

## 2023-06-23 ENCOUNTER — Telehealth: Payer: Self-pay | Admitting: Acute Care

## 2023-06-23 DIAGNOSIS — Z122 Encounter for screening for malignant neoplasm of respiratory organs: Secondary | ICD-10-CM

## 2023-06-23 DIAGNOSIS — F17210 Nicotine dependence, cigarettes, uncomplicated: Secondary | ICD-10-CM

## 2023-06-23 DIAGNOSIS — Z87891 Personal history of nicotine dependence: Secondary | ICD-10-CM

## 2023-06-23 NOTE — Telephone Encounter (Signed)
 Lung Cancer Screening Narrative/Criteria Questionnaire (Cigarette Smokers Only- No Cigars/Pipes/vapes)   Colvin Dec   SDMV:07/28/2023 at 9:00am with Acie Holiday        1946/01/16   LDCT: 07/29/2023 at 4:30pm at [facility]    78 y.o.   Phone: [phone] or [phone]  Lung Screening Narrative (confirm age 27-77 yrs Medicare / 50-80 yrs Private pay insurance)   Insurance information:BCBS   Referring Provider:Dr. Samule Crofts   This screening involves an initial phone call with a team member from our program. It is called a shared decision making visit. The initial meeting is required by  insurance and Medicare to make sure you understand the program. This appointment takes about 15-20 minutes to complete. You will complete the screening scan at your scheduled date/time.  This scan takes about 5-10 minutes to complete. You can eat and drink normally before and after the scan.  Criteria questions for Lung Cancer Screening:   Are you a current or former smoker? Current Age began smoking: 78yo   If you are a former smoker, what year did you quit smoking? Quit for 12 years but then started back again(within 15 yrs)   To calculate your smoking history, I need an accurate estimate of how many packs of cigarettes you smoked per day and for how many years. (Not just the number of PPD you are now smoking)   Years smoking 44 x Packs per day 1 = Pack years 44   (at least 20 pack yrs)   (Make sure they understand that we need to know how much they have smoked in the past, not just the number of PPD they are smoking now)  Do you have a personal history of cancer?  Yes - (type and when diagnosed - 5 yrs cancer free) Melanoma on face, excised at least 4 years ago per pt. Never received any treatment     Do you have a family history of cancer? No  Are you coughing up blood?  no  Have you had unexplained weight loss of 15 lbs or more in the last 6 months? No  It looks like you meet all criteria.  When would  be a good time for us  to schedule you for this screening?   Additional information: N/A

## 2023-07-28 ENCOUNTER — Encounter: Payer: Self-pay | Admitting: Adult Health

## 2023-07-28 ENCOUNTER — Ambulatory Visit: Admitting: Adult Health

## 2023-07-28 DIAGNOSIS — F17210 Nicotine dependence, cigarettes, uncomplicated: Secondary | ICD-10-CM

## 2023-07-28 NOTE — Patient Instructions (Signed)

## 2023-07-28 NOTE — Progress Notes (Signed)
  Virtual Visit via Telephone Note  I connected with Brandon Brandt , 07/28/23 9:04 AM by a telemedicine application and verified that I am speaking with the correct person using two identifiers.  Location: Patient: home Provider: home   I discussed the limitations of evaluation and management by telemedicine and the availability of in person appointments. The patient expressed understanding and agreed to proceed.   Shared Decision Making Visit Lung Cancer Screening Program [phone])   Eligibility: 78 y.o. Pack Years Smoking History Calculation = 44 pack years  (# packs/per year x # years smoked) Recent History of coughing up blood  no Unexplained weight loss? no ( >Than 15 pounds within the last 6 months ) Prior History Lung / other cancer no (Diagnosis within the last 5 years already requiring surveillance chest CT Scans). Skin cancer  Smoking Status Current Smoker   Visit Components: Discussion included one or more decision making aids. YES Discussion included risk/benefits of screening. YES Discussion included potential follow up diagnostic testing for abnormal scans. YES Discussion included meaning and risk of over diagnosis. YES Discussion included meaning and risk of False Positives. YES Discussion included meaning of total radiation exposure. YES  Counseling Included: Importance of adherence to annual lung cancer LDCT screening. YES Impact of comorbidities on ability to participate in the program. YES Ability and willingness to under diagnostic treatment. YES  Smoking Cessation Counseling: Current Smokers:  Discussed importance of smoking cessation. yes Information about tobacco cessation classes and interventions provided to patient. yes Patient provided with "ticket" for LDCT Scan. yes Symptomatic Patient. NO Diagnosis Code: Tobacco Use Z72.0 Asymptomatic Patient yes  Counseling - 4 minutes of smoking cessation counseling (CT Chest Lung Cancer Screening Low  Dose W/O CM) [id number]  Z12.2-Screening of respiratory organs Z87.891-Personal history of nicotine dependence   [name] 07/28/23

## 2023-07-29 ENCOUNTER — Ambulatory Visit
Admission: RE | Admit: 2023-07-29 | Discharge: 2023-07-29 | Disposition: A | Discharge status: Home / Self Care | Payer: Self-pay | Source: Ambulatory Visit | Attending: Acute Care | Admitting: Acute Care

## 2023-07-29 DIAGNOSIS — F17210 Nicotine dependence, cigarettes, uncomplicated: Secondary | ICD-10-CM | POA: Diagnosis present

## 2023-07-29 DIAGNOSIS — Z122 Encounter for screening for malignant neoplasm of respiratory organs: Secondary | ICD-10-CM | POA: Diagnosis present

## 2023-07-29 DIAGNOSIS — Z87891 Personal history of nicotine dependence: Secondary | ICD-10-CM | POA: Diagnosis present

## 2023-07-31 ENCOUNTER — Other Ambulatory Visit: Payer: Self-pay | Admitting: Urology

## 2023-07-31 DIAGNOSIS — Z87898 Personal history of other specified conditions: Secondary | ICD-10-CM

## 2023-08-25 ENCOUNTER — Other Ambulatory Visit: Payer: Self-pay

## 2023-08-25 DIAGNOSIS — F17210 Nicotine dependence, cigarettes, uncomplicated: Secondary | ICD-10-CM

## 2023-08-25 DIAGNOSIS — Z122 Encounter for screening for malignant neoplasm of respiratory organs: Secondary | ICD-10-CM

## 2023-08-25 DIAGNOSIS — Z87891 Personal history of nicotine dependence: Secondary | ICD-10-CM

## 2023-10-23 ENCOUNTER — Encounter: Payer: Self-pay | Admitting: Urology

## 2023-11-04 ENCOUNTER — Other Ambulatory Visit: Payer: Self-pay

## 2023-11-04 DIAGNOSIS — Z87898 Personal history of other specified conditions: Secondary | ICD-10-CM

## 2023-11-04 MED ORDER — FINASTERIDE 5 MG PO TABS: 5.0000 mg | ORAL_TABLET | Freq: Every day | ORAL | 1 refills | Status: DC

## 2023-11-12 ENCOUNTER — Encounter: Payer: Self-pay | Admitting: *Deleted

## 2023-11-20 ENCOUNTER — Encounter: Payer: Self-pay | Admitting: *Deleted

## 2023-11-21 HISTORY — DX: Type 2 diabetes mellitus without complications: E11.9

## 2023-11-21 HISTORY — DX: Synovial cyst of popliteal space (Baker), unspecified knee: M71.20

## 2023-11-21 HISTORY — DX: Carrier of other enterobacterales: Z22.358

## 2023-11-21 HISTORY — DX: Personal history of other malignant neoplasm of skin: Z85.828

## 2023-12-15 ENCOUNTER — Ambulatory Visit: Admitting: Urology

## 2023-12-15 ENCOUNTER — Telehealth: Payer: Self-pay

## 2023-12-15 NOTE — Progress Notes (Signed)
 Brandon Brandt, RMA TL   12/12/23  2:59 PM Note     Sent in [facility] [place] Refill CCM time 5 mins

## 2023-12-15 NOTE — Telephone Encounter (Signed)
 Pt called in with C/o difficulty urinating. Pt states getting up 6 times last night and having to strain and very weak stream to use restroom. Pt has Hx of BPH and having a cath for retention in past. I was able to book a same Day today with a PA in [place]. Pt was agreeable to the afternoon appt.

## 2023-12-16 NOTE — Progress Notes (Unsigned)
 12/18/2023 8:37 AM   Brandon Brandt 1946-02-01 [id number]  Referring provider: Jeffie Cheryl BRAVO, MD [address] [place],  [place] [address]  Urological history: 1. Elevated PSA  - PSA (2016) 9.0 - prostate bx (2017) negative   2. BPH with LU TS - PSA (02/2023) 2.46 ~ 4.92  - Tamsulosin  0.4 mg daily and finasteride  5 mg daily  3.  Epididymal orchitis - Admitted for ESBL E. Coli (2016)   4.  Urinary retention - ~Approximately 10 years ago while prepping for colonoscopy  5. Nephrolithiasis - Patient reports spontaneous passage of a stone approximately 10 years ago  Chief Complaint  Patient presents with   Follow-up   HPI: Brandon Brandt is a 78 y.o. man who presents today for a very weak stream with his wife, Brandon Brandt.    Previous records reviewed.  I PSS 24/6  He has been having almost 2 weeks of symptoms of straining to void, urinary urgency, urinary frequency, and nocturia times every hour.  Patient denies any modifying or aggravating factors.  Patient denies any recent UTI's, gross hematuria, dysuria or suprapubic/flank pain.  Patient denies any fevers, chills, nausea or vomiting.  UA yellow cloudy, specific gravity 1.020, pH 6.0, 1+ protein, 1+ heme, nitrate positive, 3+ leukocyte, greater than 30 WBCs, 11-30 RBCs, 0-2 epithelial cells and many bacteria  PVR 189 mL   PSA (02/2023) 2.46  Serum creatinine (11/2023) 0.8, eGFR 92   Hemoglobin A1c (11/2023) 7.2   Diuretics: hydrochlorothiazide   Fluid consumption: Wife states he drinks a lot of diet soda  He has been taking the tamsulosin  0.4 mg daily and the finasteride  5 mg daily.  He is also scheduled to have a colonoscopy next Friday.  PMH: Past Medical History:  Diagnosis Date   Adenomatous polyps    Baker's cyst    Benign hypertension 01/24/2015   Benign neoplasm of colon    BPH with urinary obstruction    CAD in native artery 01/24/2015   Complicated UTI (urinary tract infection)    COPD  (chronic obstructive pulmonary disease) (HCC)    Coronary artery disease    Diabetes mellitus without complication (HCC)    ESBL E. coli carrier    Hypertension    Impaired glucose tolerance    Personal history of other malignant neoplasm of skin    Pure hypercholesterolemia    Pure hypercholesterolemia    Shortness of breath dyspnea     Surgical History: Past Surgical History:  Procedure Laterality Date   CARDIAC CATHETERIZATION     CATARACT EXTRACTION W/PHACO Left 01/16/2022   Procedure: CATARACT EXTRACTION PHACO AND INTRAOCULAR LENS PLACEMENT (IOC) LEFT MALYUGIN 9.07 01:25.3;  Surgeon: Mittie Gaskin, MD;  Location: [facility];  Service: Ophthalmology;  Laterality: Left;   CATARACT EXTRACTION W/PHACO Right 02/06/2022   Procedure: CATARACT EXTRACTION PHACO AND INTRAOCULAR LENS PLACEMENT (IOC) RIGHT MALYUGIN  10.55  00:59.0;  Surgeon: Mittie Gaskin, MD;  Location: [facility];  Service: Ophthalmology;  Laterality: Right;   COLONOSCOPY     COLONOSCOPY WITH PROPOFOL  N/A 01/02/2015   Procedure: COLONOSCOPY WITH PROPOFOL ;  Surgeon: Lamar ONEIDA Holmes, MD;  Location: [facility] END[facility];  Service: Endoscopy;  Laterality: N/A;   COLONOSCOPY WITH PROPOFOL  N/A 06/19/2020   Procedure: COLONOSCOPY WITH PROPOFOL ;  Surgeon: Maryruth Ole ONEIDA, MD;  Location: [facility] ENDOSCOPY;  Service: Endoscopy;  Laterality: N/A;   EYE SURGERY      Home Medications:  Allergies as of 12/18/2023   No Known Allergies  Medication List        Accurate as of December 18, 2023  8:37 AM. If you have any questions, ask your nurse or doctor.          albuterol 108 (90 Base) MCG/ACT inhaler Commonly known as: VENTOLIN HFA Inhale into the lungs every 6 (six) hours as needed for wheezing or shortness of breath.   ascorbic acid 500 MG tablet Commonly known as: VITAMIN C Take 500 mg by mouth daily.   aspirin 81 MG tablet Take 81 mg by mouth daily. Reported on 04/12/2015    finasteride  5 MG tablet Commonly known as: PROSCAR  Take 1 tablet (5 mg total) by mouth daily.   Fluticasone-Salmeterol 250-50 MCG/DOSE Aepb Commonly known as: ADVAIR Inhale 1 puff into the lungs 2 (two) times daily.   lisinopril -hydrochlorothiazide 20-12.5 MG tablet Commonly known as: ZESTORETIC Take 1 tablet by mouth daily.   multivitamin tablet Take 1 tablet by mouth daily.   simvastatin 80 MG tablet Commonly known as: ZOCOR Take 1 tablet by mouth at bedtime.   SM OMEGA-3-6-9 FATTY ACIDS PO Take by mouth.   sulfamethoxazole-trimethoprim 800-160 MG tablet Commonly known as: BACTRIM DS Take 1 tablet by mouth every 12 (twelve) hours.   tamsulosin  0.4 MG Caps capsule Commonly known as: FLOMAX  Take 1 capsule (0.4 mg total) by mouth daily.   torsemide 10 MG tablet Commonly known as: DEMADEX Take 10 mg by mouth daily.   vitamin E 180 MG (400 UNITS) capsule Take 400 Units by mouth daily.        Allergies: No Known Allergies  Family History: Family History  Problem Relation Age of Onset   Prostate cancer Father     Social History:  reports that he has been smoking cigarettes. He has a 20 pack-year smoking history. He has been exposed to tobacco smoke. He has never used smokeless tobacco. He reports that he does not drink alcohol and does not use drugs.  ROS: Pertinent ROS in HPI  Physical Exam: BP (!) 145/71 (BP Location: Left Arm, Patient Position: Sitting, Cuff Size: Large)   Pulse 71   Ht 5' 10 (1.778 m)   Wt 218 lb (98.9 kg)   SpO2 96%   BMI 31.28 kg/m   Constitutional:  Well nourished. Alert and oriented, No acute distress. HEENT: [place] AT, moist mucus membranes.  Trachea midline Cardiovascular: No clubbing, cyanosis, or edema. Respiratory: Normal respiratory effort, no increased work of breathing. Neurologic: Grossly intact, no focal deficits, moving all 4 extremities. Psychiatric: Normal mood and affect.  Laboratory Data: See HPI and Epic  I  have reviewed the labs.   Pertinent Imaging:  12/18/23 08:07  SCA Result    Assessment & Plan:    1. Suspected UTI  - UA grossly infected  - Urine culture pending - Started empirically on Septra DS twice daily for seven days, will adjust if necessary once urine culture and sensitivity results are available  - Advised patient to increase fluid intake and monitor symptoms - Counseled on UTI prevention (good hydration) - follow-up or call if no improvement within 48-72 hours or if symptoms worsen (fever, back pain) - Advised patient to postpone his colonoscopy until next month, to allow for time to get the urinary tract infection treated and cleared  2. BPH with LU TS - worsening, severe symptoms secondary to UTI - UA benign concerning for UTI - PVR < 300 cc  - most bothersome symptoms are urgency, frequency - encouraged avoiding bladder irritants,  fluid restriction before bedtime and timed voiding's - Continue tamsulosin  0.4 mg daily and finasteride  5 mg daily;refills given - educated on red flag symptoms: acute retention, gross hematuria, fever, severe pain - advised to call clinic or go to the ED if these occur  3. Elevated PSA - has follow up with Dr. Georganne in December   Return in about 3 weeks (around 01/08/2024) for repeat UA, I PSS, PVR .  These notes generated with voice recognition software. I apologize for typographical errors.  CLOTILDA HELON RIGGERS  [facility] [address]  [address] [place], [place] [address] [phone]

## 2023-12-18 ENCOUNTER — Encounter: Payer: Self-pay | Admitting: Urology

## 2023-12-18 ENCOUNTER — Ambulatory Visit (INDEPENDENT_AMBULATORY_CARE_PROVIDER_SITE_OTHER): Admitting: Urology

## 2023-12-18 VITALS — BP 145/71 | HR 71 | Ht 70.0 in | Wt 218.0 lb

## 2023-12-18 DIAGNOSIS — N401 Enlarged prostate with lower urinary tract symptoms: Secondary | ICD-10-CM

## 2023-12-18 DIAGNOSIS — Z87898 Personal history of other specified conditions: Secondary | ICD-10-CM

## 2023-12-18 DIAGNOSIS — R3914 Feeling of incomplete bladder emptying: Secondary | ICD-10-CM | POA: Diagnosis not present

## 2023-12-18 DIAGNOSIS — R3989 Other symptoms and signs involving the genitourinary system: Secondary | ICD-10-CM

## 2023-12-18 LAB — MICROSCOPIC EXAMINATION: WBC, UA: >30 /HPF — AB (ref 0–5)

## 2023-12-18 LAB — URINALYSIS, COMPLETE
Bilirubin, UA: NEGATIVE
Glucose, UA: NEGATIVE
Ketones, UA: NEGATIVE
Nitrite, UA: POSITIVE — AB
Specific Gravity, UA: 1.02 (ref 1.005–1.030)
Urobilinogen, Ur: 0.2 mg/dL (ref 0.2–1.0)
pH, UA: 6 (ref 5.0–7.5)

## 2023-12-18 LAB — BLADDER SCAN AMB NON-IMAGING

## 2023-12-18 MED ORDER — SULFAMETHOXAZOLE-TRIMETHOPRIM 800-160 MG PO TABS: 1.0000 | ORAL_TABLET | Freq: Two times a day (BID) | ORAL | 0 refills | Status: AC

## 2023-12-19 ENCOUNTER — Ambulatory Visit: Admitting: Urology

## 2023-12-26 ENCOUNTER — Ambulatory Visit: Payer: Self-pay | Admitting: Urology

## 2023-12-26 DIAGNOSIS — R3989 Other symptoms and signs involving the genitourinary system: Secondary | ICD-10-CM

## 2023-12-26 LAB — CULTURE, URINE COMPREHENSIVE

## 2024-01-05 ENCOUNTER — Ambulatory Visit: Admitting: Urology

## 2024-01-10 NOTE — Progress Notes (Unsigned)
 01/12/2024 9:38 PM   Brandon Brandt 25-Oct-1945 [id number]  Referring provider: Jeffie Cheryl BRAVO, MD [address] [place],  [place] [address]  Urological history: 1. Elevated PSA  - PSA (2016) 9.0 - prostate bx (2017) negative   2. BPH with LU TS - PSA (02/2023) 2.46 ~ 4.92  - Tamsulosin  0.4 mg daily and finasteride  5 mg daily  3.  Epididymal orchitis - Admitted for ESBL E. Coli (2016)   4.  Urinary retention - ~Approximately 10 years ago while prepping for colonoscopy  5. Nephrolithiasis - Patient reports spontaneous passage of a stone approximately 10 years ago  Chief Complaint  Patient presents with   Benign Prostatic Hypertrophy   HPI: Brandon Brandt is a 78 y.o. man who presents today for follow up for an UTI with microscopic hematuria.   Previous records reviewed.  I PSS 8/2  He reports sensation of incomplete bladder emptying, urinary frequency, urinary urgency, a weak urinary stream, nocturia x 3.  Patient denies any modifying or aggravating factors.  Patient denies any recent UTI's, gross hematuria, dysuria or suprapubic/flank pain.  Patient denies any fevers, chills, nausea or vomiting.    He states that his symptoms of straining to void, urgency, frequency and nocturia every hour have abated.  He is now back to his baseline symptoms.  He still has irritation at times towards the glans when he voids.  Patient denies any modifying or aggravating factors.  Patient denies any recent UTI's, gross hematuria, dysuria or suprapubic/flank pain.  Patient denies any fevers, chills, nausea or vomiting.    UA yellow hazy, specific gravity 1.011, pH 6.0, large leukocyte, 6-10 RBCs, greater than 50 WBCs and mucus present.  PVR 316 mL   PSA (02/2023) 2.46  Serum creatinine (11/2023) 0.8, eGFR 92   Hemoglobin A1c (11/2023) 7.2   Diuretics: hydrochlorothiazide   Fluid consumption: Wife states he drinks a lot of diet soda  He has been taking the tamsulosin  0.4 mg  daily and the finasteride  5 mg daily.  PMH: Past Medical History:  Diagnosis Date   Adenomatous polyps    Baker's cyst    Benign hypertension 01/24/2015   Benign neoplasm of colon    BPH with urinary obstruction    CAD in native artery 01/24/2015   Complicated UTI (urinary tract infection)    COPD (chronic obstructive pulmonary disease) (HCC)    Coronary artery disease    Diabetes mellitus without complication (HCC)    ESBL E. coli carrier    History of adenomatous polyp of colon    Hypertension    Impaired glucose tolerance    Personal history of other malignant neoplasm of skin    Pure hypercholesterolemia    Shortness of breath dyspnea     Surgical History: Past Surgical History:  Procedure Laterality Date   CARDIAC CATHETERIZATION     CATARACT EXTRACTION W/PHACO Left 01/16/2022   Procedure: CATARACT EXTRACTION PHACO AND INTRAOCULAR LENS PLACEMENT (IOC) LEFT MALYUGIN 9.07 01:25.3;  Surgeon: Mittie Gaskin, MD;  Location: [facility];  Service: Ophthalmology;  Laterality: Left;   CATARACT EXTRACTION W/PHACO Right 02/06/2022   Procedure: CATARACT EXTRACTION PHACO AND INTRAOCULAR LENS PLACEMENT (IOC) RIGHT MALYUGIN  10.55  00:59.0;  Surgeon: Mittie Gaskin, MD;  Location: [facility];  Service: Ophthalmology;  Laterality: Right;   COLONOSCOPY     COLONOSCOPY WITH PROPOFOL  N/A 01/02/2015   Procedure: COLONOSCOPY WITH PROPOFOL ;  Surgeon: Lamar ONEIDA Holmes, MD;  Location: [facility] END[facility];  Service:  Endoscopy;  Laterality: N/A;   COLONOSCOPY WITH PROPOFOL  N/A 06/19/2020   Procedure: COLONOSCOPY WITH PROPOFOL ;  Surgeon: Maryruth Ole DASEN, MD;  Location: [facility] ENDOSCOPY;  Service: Endoscopy;  Laterality: N/A;   EYE SURGERY      Home Medications:  Allergies as of 01/12/2024   No Known Allergies      Medication List        Accurate as of January 12, 2024 11:59 PM. If you have any questions, ask your nurse or doctor.          albuterol 108 (90  Base) MCG/ACT inhaler Commonly known as: VENTOLIN HFA Inhale into the lungs every 6 (six) hours as needed for wheezing or shortness of breath.   ascorbic acid 500 MG tablet Commonly known as: VITAMIN C Take 500 mg by mouth daily.   aspirin 81 MG tablet Take 81 mg by mouth daily. Reported on 04/12/2015   finasteride  5 MG tablet Commonly known as: PROSCAR  Take 1 tablet (5 mg total) by mouth daily.   Fluticasone-Salmeterol 250-50 MCG/DOSE Aepb Commonly known as: ADVAIR Inhale 1 puff into the lungs 2 (two) times daily.   lisinopril -hydrochlorothiazide 20-12.5 MG tablet Commonly known as: ZESTORETIC Take 1 tablet by mouth daily.   multivitamin tablet Take 1 tablet by mouth daily.   nystatin-triamcinolone ointment Commonly known as: MYCOLOG Apply 1 Application topically 2 (two) times daily. Started by: CLOTILDA CORNWALL   simvastatin 80 MG tablet Commonly known as: ZOCOR Take 1 tablet by mouth at bedtime.   SM OMEGA-3-6-9 FATTY ACIDS PO Take by mouth.   sulfamethoxazole-trimethoprim 800-160 MG tablet Commonly known as: BACTRIM DS Take 1 tablet by mouth every 12 (twelve) hours.   tamsulosin  0.4 MG Caps capsule Commonly known as: FLOMAX  Take 1 capsule (0.4 mg total) by mouth daily.   torsemide 10 MG tablet Commonly known as: DEMADEX Take 10 mg by mouth daily.   vitamin E 180 MG (400 UNITS) capsule Take 400 Units by mouth daily.        Allergies: No Known Allergies  Family History: Family History  Problem Relation Age of Onset   Prostate cancer Father     Social History:  reports that he has been smoking cigarettes. He has a 20 pack-year smoking history. He has been exposed to tobacco smoke. He has never used smokeless tobacco. He reports that he does not drink alcohol and does not use drugs.  ROS: Pertinent ROS in HPI  Physical Exam: BP (!) 154/62 (BP Location: Left Arm, Patient Position: Sitting, Cuff Size: Large)   Pulse 71   Wt 220 lb (99.8 kg)   SpO2  98%   BMI 31.57 kg/m   Constitutional:  Well nourished. Alert and oriented, No acute distress. HEENT: [place] AT, moist mucus membranes.  Trachea midline Cardiovascular: No clubbing, cyanosis, or edema. Respiratory: Normal respiratory effort, no increased work of breathing. GU: No CVA tenderness.  No bladder fullness or masses.  Patient with uncircumcised phallus. Foreskin easily retracted  Urethral meatus is patent.  No penile discharge. No penile lesions or rashes.  Neurologic: Grossly intact, no focal deficits, moving all 4 extremities. Psychiatric: Normal mood and affect.   Laboratory Data: See HPI and Epic  I have reviewed the labs.   Pertinent Imaging:  01/12/24 16:29  Scan Result    Assessment & Plan:    1. Staphylococcus epidermidis/enterococcus UTI  - UA still w/ pyuria and heme, urine culture was not sent as UA resulted after hours - did have patient  start another course of Cipro  250 mg BID x 7 days - also sent in Mycolog cream to apply to the glans twice daily for the irritation  - he has a follow up next month with Dr. Georganne to recheck symptoms and ensure the micro heme clears   2. BPH with LU TS - improving, moderate symptoms  - PVR > 300 cc  - most bothersome symptoms are nocturia - encouraged avoiding bladder irritants, fluid restriction before bedtime and timed voiding's - Continue tamsulosin  0.4 mg daily and finasteride  5 mg daily - I did mention that he may want to consider an outlet procedure in light of his recent infection, but he deferred at this time   3. Elevated PSA - had reached 9 with a negative biopsy in 2016, has normalized on the finasteride , but had increased slightly to 2.46 from 1.75 the year before, so yearly screening has been recommended  - has follow up with Dr. Georganne in December for repeat PSA   Return for keep appointment with Dr. Georganne .  These notes generated with voice recognition software. I apologize for typographical  errors.  Aeralyn Barna, PA-C.  [facility] [address]  [address] [place], [place] [address] [phone]

## 2024-01-12 ENCOUNTER — Ambulatory Visit: Admitting: Urology

## 2024-01-12 ENCOUNTER — Ambulatory Visit: Payer: Self-pay | Admitting: Urology

## 2024-01-12 ENCOUNTER — Other Ambulatory Visit
Admission: RE | Admit: 2024-01-12 | Discharge: 2024-01-12 | Disposition: A | Discharge status: Home / Self Care | Attending: Urology | Admitting: Urology

## 2024-01-12 ENCOUNTER — Other Ambulatory Visit: Payer: Self-pay

## 2024-01-12 VITALS — BP 154/62 | HR 71 | Wt 220.0 lb

## 2024-01-12 DIAGNOSIS — R3989 Other symptoms and signs involving the genitourinary system: Secondary | ICD-10-CM | POA: Insufficient documentation

## 2024-01-12 DIAGNOSIS — N390 Urinary tract infection, site not specified: Secondary | ICD-10-CM

## 2024-01-12 DIAGNOSIS — R3914 Feeling of incomplete bladder emptying: Secondary | ICD-10-CM

## 2024-01-12 DIAGNOSIS — B952 Enterococcus as the cause of diseases classified elsewhere: Secondary | ICD-10-CM

## 2024-01-12 DIAGNOSIS — N401 Enlarged prostate with lower urinary tract symptoms: Secondary | ICD-10-CM | POA: Diagnosis not present

## 2024-01-12 DIAGNOSIS — R972 Elevated prostate specific antigen [PSA]: Secondary | ICD-10-CM | POA: Diagnosis not present

## 2024-01-12 LAB — BLADDER SCAN AMB NON-IMAGING

## 2024-01-12 LAB — URINALYSIS, COMPLETE (UACMP) WITH MICROSCOPIC
Bacteria, UA: NONE SEEN
Bilirubin Urine: NEGATIVE
Glucose, UA: NEGATIVE mg/dL
Hgb urine dipstick: NEGATIVE
Ketones, ur: NEGATIVE mg/dL
Nitrite: NEGATIVE
Protein, ur: NEGATIVE mg/dL
Specific Gravity, Urine: 1.011 (ref 1.005–1.030)
Squamous Epithelial / HPF: 0 /HPF (ref 0–5)
WBC, UA: >50 WBC/hpf (ref 0–5)
pH: 6 (ref 5.0–8.0)

## 2024-01-12 MED ORDER — NYSTATIN-TRIAMCINOLONE 100000-0.1 UNIT/GM-% EX OINT: 1.0000 | TOPICAL_OINTMENT | Freq: Two times a day (BID) | CUTANEOUS | 0 refills | Status: AC

## 2024-01-13 ENCOUNTER — Encounter: Payer: Self-pay | Admitting: *Deleted

## 2024-01-13 ENCOUNTER — Other Ambulatory Visit: Payer: Self-pay

## 2024-01-13 MED ORDER — CIPROFLOXACIN HCL 250 MG PO TABS: 250.0000 mg | ORAL_TABLET | Freq: Two times a day (BID) | ORAL | 0 refills | Status: AC

## 2024-01-15 ENCOUNTER — Encounter: Payer: Self-pay | Admitting: Urology

## 2024-01-22 ENCOUNTER — Encounter: Payer: Self-pay | Admitting: Urology

## 2024-01-23 ENCOUNTER — Ambulatory Visit
Admission: RE | Admit: 2024-01-23 | Discharge: 2024-01-23 | Disposition: A | Discharge status: Home / Self Care | Attending: Gastroenterology | Admitting: Gastroenterology

## 2024-01-23 ENCOUNTER — Ambulatory Visit: Admitting: Anesthesiology

## 2024-01-23 ENCOUNTER — Encounter
Admission: RE | Disposition: A | Discharge status: Home / Self Care | Payer: Self-pay | Source: Home / Self Care | Attending: Gastroenterology

## 2024-01-23 ENCOUNTER — Ambulatory Visit: Admission: RE | Admit: 2024-01-23 | Source: Home / Self Care

## 2024-01-23 ENCOUNTER — Encounter: Admission: RE | Payer: Self-pay | Source: Home / Self Care

## 2024-01-23 ENCOUNTER — Other Ambulatory Visit: Payer: Self-pay

## 2024-01-23 DIAGNOSIS — F17200 Nicotine dependence, unspecified, uncomplicated: Secondary | ICD-10-CM | POA: Insufficient documentation

## 2024-01-23 DIAGNOSIS — J449 Chronic obstructive pulmonary disease, unspecified: Secondary | ICD-10-CM | POA: Diagnosis not present

## 2024-01-23 DIAGNOSIS — K640 First degree hemorrhoids: Secondary | ICD-10-CM | POA: Insufficient documentation

## 2024-01-23 DIAGNOSIS — K5730 Diverticulosis of large intestine without perforation or abscess without bleeding: Secondary | ICD-10-CM | POA: Insufficient documentation

## 2024-01-23 DIAGNOSIS — D123 Benign neoplasm of transverse colon: Secondary | ICD-10-CM | POA: Insufficient documentation

## 2024-01-23 DIAGNOSIS — Z09 Encounter for follow-up examination after completed treatment for conditions other than malignant neoplasm: Secondary | ICD-10-CM | POA: Diagnosis present

## 2024-01-23 DIAGNOSIS — I2510 Atherosclerotic heart disease of native coronary artery without angina pectoris: Secondary | ICD-10-CM | POA: Diagnosis not present

## 2024-01-23 DIAGNOSIS — E119 Type 2 diabetes mellitus without complications: Secondary | ICD-10-CM | POA: Diagnosis not present

## 2024-01-23 DIAGNOSIS — I10 Essential (primary) hypertension: Secondary | ICD-10-CM | POA: Insufficient documentation

## 2024-01-23 HISTORY — PX: SUBMUCOSAL INJECTION: SHX5543

## 2024-01-23 HISTORY — PX: POLYPECTOMY: SHX149

## 2024-01-23 HISTORY — PX: COLONOSCOPY: SHX5424

## 2024-01-23 HISTORY — DX: Personal history of adenomatous and serrated colon polyps: Z86.0101

## 2024-01-23 LAB — GLUCOSE, CAPILLARY: Glucose-Capillary: 127 mg/dL — ABNORMAL HIGH (ref 70–99)

## 2024-01-23 SURGERY — COLONOSCOPY
Anesthesia: General

## 2024-01-23 MED ORDER — DEXMEDETOMIDINE HCL IN NACL 80 MCG/20ML IV SOLN
INTRAVENOUS | Status: DC | PRN
  Administered 2024-01-23: 8 ug via INTRAVENOUS
  Administered 2024-01-23: 12 ug via INTRAVENOUS

## 2024-01-23 MED ORDER — PROPOFOL 1000 MG/100ML IV EMUL
INTRAVENOUS | Status: AC
  Filled 2024-01-23: qty 100

## 2024-01-23 MED ORDER — LIDOCAINE HCL (PF) 2 % IJ SOLN
INTRAMUSCULAR | Status: AC
  Filled 2024-01-23: qty 5

## 2024-01-23 MED ORDER — PROPOFOL 10 MG/ML IV BOLUS
INTRAVENOUS | Status: DC | PRN
  Administered 2024-01-23 (×2): 50 mg via INTRAVENOUS

## 2024-01-23 MED ORDER — LIDOCAINE HCL (CARDIAC) PF 100 MG/5ML IV SOSY
PREFILLED_SYRINGE | INTRAVENOUS | Status: DC | PRN
  Administered 2024-01-23: 80 mg via INTRAVENOUS

## 2024-01-23 MED ORDER — SPOT INK MARKER SYRINGE KIT
PACK | SUBMUCOSAL | Status: DC | PRN
  Administered 2024-01-23: .05 mL via SUBMUCOSAL

## 2024-01-23 MED ORDER — SODIUM CHLORIDE 0.9 % IV SOLN: INTRAVENOUS | Status: DC

## 2024-01-23 MED ORDER — PROPOFOL 500 MG/50ML IV EMUL
INTRAVENOUS | Status: DC | PRN
  Administered 2024-01-23: 75 ug/kg/min via INTRAVENOUS

## 2024-01-23 MED ORDER — PHENYLEPHRINE 80 MCG/ML (10ML) SYRINGE FOR IV PUSH (FOR BLOOD PRESSURE SUPPORT)
PREFILLED_SYRINGE | INTRAVENOUS | Status: DC | PRN
  Administered 2024-01-23: 160 ug via INTRAVENOUS

## 2024-01-23 NOTE — H&P (Signed)
 Outpatient short stay form Pre-procedure 01/23/2024  [name], MD  Primary Physician: Jeffie Cheryl BRAVO, MD  Reason for visit:  Surveillance  History of present illness:    78 y/o gentleman with history of BPH, HLD, and hypertension here for surveillance colonoscopy. Had three small Ta's on last colonoscopy three years ago. No blood thinners. No family history of GI malignancies. No significant abdominal surgeries.    Current Facility-Administered Medications:    0.9 %  sodium chloride  infusion, , Intravenous, Continuous, Nikira Kushnir, Ole ONEIDA, MD, Last Rate: 20 mL/hr at 01/23/24 1109, Continued from Pre-op at 01/23/24 1109  Medications Prior to Admission  Medication Sig Dispense Refill Last Dose/Taking   ascorbic acid (VITAMIN C) 500 MG tablet Take 500 mg by mouth daily.   Past Week   finasteride  (PROSCAR ) 5 MG tablet Take 1 tablet (5 mg total) by mouth daily. 90 tablet 1 01/22/2024   Fluticasone-Salmeterol (ADVAIR) 250-50 MCG/DOSE AEPB Inhale 1 puff into the lungs 2 (two) times daily.   01/23/2024   lisinopril -hydrochlorothiazide (PRINZIDE,ZESTORETIC) 20-12.5 MG tablet Take 1 tablet by mouth daily.   01/22/2024   Multiple Vitamin (MULTIVITAMIN) tablet Take 1 tablet by mouth daily.   Past Week   simvastatin (ZOCOR) 80 MG tablet Take 1 tablet by mouth at bedtime.   01/22/2024   SM OMEGA-3-6-9 FATTY ACIDS PO Take by mouth.   Past Week   tamsulosin  (FLOMAX ) 0.4 MG CAPS capsule Take 1 capsule (0.4 mg total) by mouth daily. 90 capsule 2 01/22/2024   torsemide (DEMADEX) 10 MG tablet Take 10 mg by mouth daily.   01/22/2024   vitamin E 180 MG (400 UNITS) capsule Take 400 Units by mouth daily.   Past Week   albuterol (PROVENTIL HFA;VENTOLIN HFA) 108 (90 BASE) MCG/ACT inhaler Inhale into the lungs every 6 (six) hours as needed for wheezing or shortness of breath.      aspirin 81 MG tablet Take 81 mg by mouth daily. Reported on 04/12/2015   01/20/2024   ciprofloxacin  (CIPRO ) 250 MG tablet  Take 1 tablet (250 mg total) by mouth 2 (two) times daily. (Patient not taking: Reported on 01/23/2024) 14 tablet 0 Completed Course   nystatin-triamcinolone ointment (MYCOLOG) Apply 1 Application topically 2 (two) times daily. 30 g 0    sulfamethoxazole-trimethoprim (BACTRIM DS) 800-160 MG tablet Take 1 tablet by mouth every 12 (twelve) hours. (Patient not taking: Reported on 01/23/2024) 14 tablet 0 Completed Course     No Known Allergies   Past Medical History:  Diagnosis Date   Adenomatous polyps    Baker's cyst    Benign hypertension 01/24/2015   Benign neoplasm of colon    BPH with urinary obstruction    CAD in native artery 01/24/2015   Complicated UTI (urinary tract infection)    COPD (chronic obstructive pulmonary disease) (HCC)    Coronary artery disease    Diabetes mellitus without complication (HCC)    ESBL E. coli carrier    History of adenomatous polyp of colon    Hypertension    Impaired glucose tolerance    Personal history of other malignant neoplasm of skin    Pure hypercholesterolemia    Shortness of breath dyspnea     Review of systems:  Otherwise negative.    Physical Exam  Gen: Alert, oriented. Appears stated age.  HEENT: PERRLA. Lungs: No respiratory distress CV: RRR Abd: soft, benign, no masses Ext: No edema    Planned procedures: Proceed with colonoscopy. The patient understands the nature  of the planned procedure, indications, risks, alternatives and potential complications including but not limited to bleeding, infection, perforation, damage to internal organs and possible oversedation/side effects from anesthesia. The patient agrees and gives consent to proceed.  Please refer to procedure notes for findings, recommendations and patient disposition/instructions.     [name], MD [facility]

## 2024-01-23 NOTE — Transfer of Care (Signed)
 Immediate Anesthesia Transfer of Care Note  Patient: Brandon Brandt  Procedure(s) Performed: COLONOSCOPY POLYPECTOMY, INTESTINE INJECTION, SUBMUCOSAL  Patient Location: PACU  Anesthesia Type:General  Level of Consciousness: sedated  Airway & Oxygen Therapy: Patient Spontanous Breathing  Post-op Assessment: Report given to RN and Post -op Vital signs reviewed and stable  Post vital signs: Reviewed and stable  Last Vitals:  Vitals Value Taken Time  BP 106/60 01/23/24 12:01  Temp 36.1 C 01/23/24 12:01  Pulse 66 01/23/24 12:02  Resp 21 01/23/24 12:02  SpO2 97 % 01/23/24 12:02  Vitals shown include unfiled device data.  Last Pain:  Vitals:   01/23/24 1201  TempSrc: Temporal  PainSc: Asleep         Complications: No notable events documented.

## 2024-01-23 NOTE — Interval H&P Note (Signed)
 History and Physical Interval Note:  01/23/2024 11:32 AM  Brandon Brandt  has presented today for surgery, with the diagnosis of HX.OF COLON POLYPS.  The various methods of treatment have been discussed with the patient and family. After consideration of risks, benefits and other options for treatment, the patient has consented to  Procedure(s): COLONOSCOPY (N/A) as a surgical intervention.  The patient's history has been reviewed, patient examined, no change in status, stable for surgery.  I have reviewed the patient's chart and labs.  Questions were answered to the patient's satisfaction.     [name]  Ok to proceed with colonoscopy

## 2024-01-23 NOTE — Anesthesia Preprocedure Evaluation (Addendum)
 Anesthesia Evaluation  Patient identified by MRN, date of birth, ID band Patient awake    Reviewed: Allergy & Precautions, H&P , NPO status , Patient's Chart, lab work & pertinent test results  Airway Mallampati: II  TM Distance: >3 FB Neck ROM: full    Dental  (+) Implants, Missing,    Pulmonary shortness of breath, COPD,  COPD inhaler, Current Smoker and Patient abstained from smoking.   Pulmonary exam normal        Cardiovascular hypertension, + CAD  Normal cardiovascular exam     Neuro/Psych negative neurological ROS  negative psych ROS   GI/Hepatic negative GI ROS, Neg liver ROS,,,  Endo/Other  diabetes, Type 2    Renal/GU negative Renal ROS  negative genitourinary   Musculoskeletal   Abdominal  (+) + obese  Peds  Hematology negative hematology ROS (+)   Anesthesia Other Findings Past Medical History: No date: Adenomatous polyps No date: Baker's cyst 01/24/2015: Benign hypertension No date: Benign neoplasm of colon No date: BPH with urinary obstruction 01/24/2015: CAD in native artery No date: Complicated UTI (urinary tract infection) No date: COPD (chronic obstructive pulmonary disease) (HCC) No date: Coronary artery disease No date: Diabetes mellitus without complication (HCC) No date: ESBL E. coli carrier No date: History of adenomatous polyp of colon No date: Hypertension No date: Impaired glucose tolerance No date: Personal history of other malignant neoplasm of skin No date: Pure hypercholesterolemia No date: Shortness of breath dyspnea  Past Surgical History: No date: CARDIAC CATHETERIZATION 01/16/2022: CATARACT EXTRACTION W/PHACO; Left     Comment:  Procedure: CATARACT EXTRACTION PHACO AND INTRAOCULAR               LENS PLACEMENT (IOC) LEFT MALYUGIN 9.07 01:25.3;                Surgeon: Mittie Gaskin, MD;  Location: [facility];  Service: Ophthalmology;   Laterality: Left; 02/06/2022: CATARACT EXTRACTION W/PHACO; Right     Comment:  Procedure: CATARACT EXTRACTION PHACO AND INTRAOCULAR               LENS PLACEMENT (IOC) RIGHT MALYUGIN  10.55  00:59.0;                Surgeon: Mittie Gaskin, MD;  Location: [facility];  Service: Ophthalmology;  Laterality:               Right; No date: COLONOSCOPY 01/02/2015: COLONOSCOPY WITH PROPOFOL ; N/A     Comment:  Procedure: COLONOSCOPY WITH PROPOFOL ;  Surgeon: Lamar ONEIDA Holmes, MD;  Location: [facility];  Service:               Endoscopy;  Laterality: N/A; 06/19/2020: COLONOSCOPY WITH PROPOFOL ; N/A     Comment:  Procedure: COLONOSCOPY WITH PROPOFOL ;  Surgeon:               Maryruth Ole ONEIDA, MD;  Location: [facility] ENDOSCOPY;                Service: Endoscopy;  Laterality: N/A; No date: EYE SURGERY  BMI    Body Mass Index: 30.13 kg/m      Reproductive/Obstetrics negative OB ROS  Anesthesia Physical Anesthesia Plan  ASA: 3  Anesthesia Plan: General   Post-op Pain Management: Minimal or no pain anticipated   Induction: Intravenous  PONV Risk Score and Plan: Propofol  infusion and TIVA  Airway Management Planned: Natural Airway  Additional Equipment:   Intra-op Plan:   Post-operative Plan:   Informed Consent: I have reviewed the patients History and Physical, chart, labs and discussed the procedure including the risks, benefits and alternatives for the proposed anesthesia with the patient or authorized representative who has indicated his/her understanding and acceptance.     Dental Advisory Given  Plan Discussed with: CRNA and Surgeon  Anesthesia Plan Comments:          Anesthesia Quick Evaluation

## 2024-01-23 NOTE — Anesthesia Postprocedure Evaluation (Signed)
 Anesthesia Post Note  Patient: Brandon Brandt  Procedure(s) Performed: COLONOSCOPY POLYPECTOMY, INTESTINE INJECTION, SUBMUCOSAL  Patient location during evaluation: Endoscopy Anesthesia Type: General Level of consciousness: awake and alert Pain management: pain level controlled Vital Signs Assessment: post-procedure vital signs reviewed and stable Respiratory status: spontaneous breathing, nonlabored ventilation and respiratory function stable Cardiovascular status: blood pressure returned to baseline and stable Postop Assessment: no apparent nausea or vomiting Anesthetic complications: no   No notable events documented.   Last Vitals:  Vitals:   01/23/24 1224 01/23/24 1234  BP: (!) 105/57 106/66  Pulse: 70 76  Resp: 12 16  Temp:    SpO2: 99% 99%    Last Pain:  Vitals:   01/23/24 1234  TempSrc:   PainSc: 0-No pain                 Camellia Merilee Louder

## 2024-01-23 NOTE — Op Note (Signed)
 [facility] Patient Name: Brandon Brandt Procedure Date: 01/23/2024 11:26 AM MRN: [id number] Account #: [id number] Date of Birth: 09-Dec-1945 Admit Type: Outpatient Age: 78 Room: [facility] ENDO ROOM 3 Gender: Male Note Status: Finalized Instrument Name: Colon Scope [id number] Procedure:             Colonoscopy Indications:           Surveillance: Personal history of adenomatous polyps                         on last colonoscopy 3 years ago Providers:             Ole Schick MD, MD Referring MD:          Cheryl CHARLENA Jericho (Referring MD) Medicines:             Monitored Anesthesia Care Complications:         No immediate complications. Estimated blood loss:                         Minimal. Procedure:             Pre-Anesthesia Assessment:                        - Prior to the procedure, a History and Physical was                         performed, and patient medications and allergies were                         reviewed. The patient is competent. The risks and                         benefits of the procedure and the sedation options and                         risks were discussed with the patient. All questions                         were answered and informed consent was obtained.                         Patient identification and proposed procedure were                         verified by the physician, the nurse, the                         anesthesiologist, the anesthetist and the technician                         in the endoscopy suite. Mental Status Examination:                         alert and oriented. Airway Examination: normal                         oropharyngeal airway and neck mobility. Respiratory  Examination: clear to auscultation. CV Examination:                         normal. Prophylactic Antibiotics: The patient does not                         require prophylactic antibiotics. Prior                          Anticoagulants: The patient has taken no anticoagulant                         or antiplatelet agents. ASA Grade Assessment: III - A                         patient with severe systemic disease. After reviewing                         the risks and benefits, the patient was deemed in                         satisfactory condition to undergo the procedure. The                         anesthesia plan was to use monitored anesthesia care                         (MAC). Immediately prior to administration of                         medications, the patient was re-assessed for adequacy                         to receive sedatives. The heart rate, respiratory                         rate, oxygen saturations, blood pressure, adequacy of                         pulmonary ventilation, and response to care were                         monitored throughout the procedure. The physical                         status of the patient was re-assessed after the                         procedure.                        After obtaining informed consent, the colonoscope was                         passed under direct vision. Throughout the procedure,                         the patient's blood pressure, pulse, and oxygen  saturations were monitored continuously. The was                         introduced through the anus and advanced to the the                         terminal ileum, with identification of the appendiceal                         orifice and IC valve. The colonoscopy was performed                         without difficulty. The patient tolerated the                         procedure well. The quality of the bowel preparation                         was good. The terminal ileum, ileocecal valve,                         appendiceal orifice, and rectum were photographed. Findings:      The perianal and digital rectal examinations were normal.      The terminal ileum  appeared normal.      A localized area of mildly erythematous and nodular mucosa was found in       the proximal transverse colon. Biopsies were taken with a cold forceps       for histology. Estimated blood loss was minimal. Area was tattooed with       an injection of 0.3 mL of [place] ink.      A 4 mm polyp was found in the proximal transverse colon. The polyp was       sessile. The polyp was removed with a cold snare. Resection and       retrieval were complete. Estimated blood loss was minimal.      A few small-mouthed diverticula were found in the sigmoid colon.      Internal hemorrhoids were found during retroflexion. The hemorrhoids       were Grade I (internal hemorrhoids that do not prolapse).      The exam was otherwise without abnormality on direct and retroflexion       views. Impression:            - The examined portion of the ileum was normal.                        - Erythematous and nodular mucosa in the proximal                         transverse colon. Biopsied. Tattooed.                        - One 4 mm polyp in the proximal transverse colon,                         removed with a cold snare. Resected and retrieved.                        - Diverticulosis  in the sigmoid colon.                        - Internal hemorrhoids.                        - The examination was otherwise normal on direct and                         retroflexion views. Recommendation:        - Discharge patient to home.                        - Resume previous diet.                        - Continue present medications.                        - Await pathology results.                        - Repeat colonoscopy for surveillance based on                         pathology results.                        - Return to referring physician as previously                         scheduled. Procedure Code(s):     --- Professional ---                        [phone], Colonoscopy, flexible; with removal of                          tumor(s), polyp(s), or other lesion(s) by snare                         technique                        45380, 59, Colonoscopy, flexible; with biopsy, single                         or multiple                        45381, Colonoscopy, flexible; with directed submucosal                         injection(s), any substance Diagnosis Code(s):     --- Professional ---                        Z86.010, Personal history of colonic polyps                        K64.0, First degree hemorrhoids                        K63.89, Other specified diseases of intestine  D12.3, Benign neoplasm of transverse colon (hepatic                         flexure or splenic flexure)                        K57.30, Diverticulosis of large intestine without                         perforation or abscess without bleeding CPT copyright 2022 American Medical Association. All rights reserved. The codes documented in this report are preliminary and upon coder review may  be revised to meet current compliance requirements. Ole Schick MD, MD 01/23/2024 12:00:57 PM Number of Addenda: 0 Note Initiated On: 01/23/2024 11:26 AM Scope Withdrawal Time: 0 hours 10 minutes 58 seconds  Total Procedure Duration: 0 hours 13 minutes 12 seconds  Estimated Blood Loss:  Estimated blood loss was minimal.      [facility]

## 2024-01-26 LAB — SURGICAL PATHOLOGY

## 2024-02-04 DIAGNOSIS — R972 Elevated prostate specific antigen [PSA]: Secondary | ICD-10-CM | POA: Insufficient documentation

## 2024-02-04 DIAGNOSIS — R3129 Other microscopic hematuria: Secondary | ICD-10-CM | POA: Insufficient documentation

## 2024-02-04 NOTE — Progress Notes (Signed)
   02/11/2024 8:18 AM   Brandon Brandt [birth year] [id number]  Reason for visit: Follow up elevated PSA   HPI: 78 y.o. male, initial follow up with me today, previously seen by PA McGowan in Nov 2025 First time meeting me today He has no complaints, prior subacute LUTS fully resolved from [facility] symptomatic UTI No gross hematuria IPSS - 8/1 , ER 180 cc Resting refill of Flomax   Prior HPI: Hx of elevated PSA  - negative biopsy in 2016, PSA ~9  - PSAs stable at 2-5 (corrected 4-10 on finasteride ) since 2017  Hx of BPH  - on flomax /finasteride   Hx of UTI  - E. Faecalis (12/18/23) - ?asymptomatic, briefly took Bactrim     Physical Exam: BP (!) 140/59   Pulse 75   Ht 5' 10 (1.778 m)   Wt 210 lb (95.3 kg)   BMI 30.13 kg/m    Constitutional:  Alert and oriented, No acute distress.   Laboratory Data:  Latest Reference Range & Units 03/08/15 08:58 03/15/15 08:30 10/18/15 08:32 10/25/16 14:51 12/19/17 15:41 05/11/18 10:06 11/05/18 08:54 11/30/19 15:32 12/01/20 15:43 12/07/21 15:45 02/10/23 14:55  Prostate Specific Ag, Serum 0.0 - 4.0 ng/mL 9.0 (H) 8.2 (H) 3.5          Prostatic Specific Antigen 0.00 - 4.00 ng/mL    4.10 (H) 5.08 (H) 3.87 2.45 2.35 1.27 1.75 2.46  (H): Data is abnormally high  Pertinent Imaging: N/A    Assessment & Plan:    Elevated PSA Assessment & Plan: Hx of elevated PSA  - negative biopsy in 2016, PSA ~9  - PSAs stable at 2-5 (corrected 4-10 on finasteride ) since 2018   Reviewed his clinical history and PSA data since 2016.  Recent PSAs have been very stable on finasteride , corrected to ~4-6.  This is well below his high mark of 9 at time of his negative biopsy in 2016.  I would maintain a high threshold to repeat a formal workup or biopsy.  Additionally, we may consider discontinuation of routine PSA draws at his age (78).   - repeat PSA, if stable --> discontinue further routine screening   Orders: -     BLADDER SCAN AMB NON-IMAGING -      PSA; Future  Microhematuria Assessment & Plan: Recent E faecalis UTI in Oct 2025  -Subsequent urinalyses with pyuria and RBCs   - Recommend repeat urinalysis and 4-6 months, in an asymptomatic state - Would only workup if true isolated asymptomatic microhematuria    Benign prostatic hyperplasia with urinary obstruction Assessment & Plan: On Flomax /finasteride  PVRs >300cc Hx of UTI in Oct 2025  Overall doing well-seems to be recovering from isolated single UTI in October.  LUTS have returned to baseline and minimally bothersome, IPSS 8/1 today.  PVR also better than historic at 180 cc  - Continue Flomax /finasteride .  Will refill Flomax  today - If he incurs a second UTI I did recommend proceeding with more workup -either cystoscopy plus TRUS prostate sizing versus cystoscopy and CT urogram (considering intermittent microhematuria)  Orders: -     BLADDER SCAN AMB NON-IMAGING -     PSA; Future       [name], MD  [facility] [address] [place], [place] [address] [phone]

## 2024-02-04 NOTE — Assessment & Plan Note (Addendum)
 Hx of elevated PSA  - negative biopsy in 2016, PSA ~9  - PSAs stable at 2-5 (corrected 4-10 on finasteride ) since 2018   Reviewed his clinical history and PSA data since 2016.  Recent PSAs have been very stable on finasteride , corrected to ~4-6.  This is well below his high mark of 9 at time of his negative biopsy in 2016.  I would maintain a high threshold to repeat a formal workup or biopsy.  Additionally, we may consider discontinuation of routine PSA draws at his age.   - repeat PSA, if stable --> discontinue further routine screening

## 2024-02-04 NOTE — Assessment & Plan Note (Signed)
 Recent E faecalis UTI in Oct 2025  -Subsequent urinalyses with pyuria and RBCs   - Recommend repeat urinalysis and 4-6 months, in an asymptomatic state - Would only workup if true isolated asymptomatic microhematuria

## 2024-02-11 ENCOUNTER — Ambulatory Visit: Admitting: Urology

## 2024-02-11 VITALS — BP 140/59 | HR 75 | Ht 70.0 in | Wt 210.0 lb

## 2024-02-11 DIAGNOSIS — R3129 Other microscopic hematuria: Secondary | ICD-10-CM | POA: Diagnosis not present

## 2024-02-11 DIAGNOSIS — N401 Enlarged prostate with lower urinary tract symptoms: Secondary | ICD-10-CM | POA: Diagnosis not present

## 2024-02-11 DIAGNOSIS — Z87898 Personal history of other specified conditions: Secondary | ICD-10-CM

## 2024-02-11 DIAGNOSIS — N138 Other obstructive and reflux uropathy: Secondary | ICD-10-CM

## 2024-02-11 DIAGNOSIS — R972 Elevated prostate specific antigen [PSA]: Secondary | ICD-10-CM

## 2024-02-11 MED ORDER — TAMSULOSIN HCL 0.4 MG PO CAPS: 0.4000 mg | ORAL_CAPSULE | Freq: Every day | ORAL | 3 refills | Status: AC

## 2024-02-11 NOTE — Assessment & Plan Note (Addendum)
 On Flomax /finasteride  PVRs >300cc Hx of UTI in Oct 2025  Overall doing well-seems to be recovering from isolated single UTI in October.  LUTS have returned to baseline and minimally bothersome, IPSS 8/1 today.  PVR also better than historic at 180 cc  - Continue Flomax /finasteride .  Will refill Flomax  today - If he incurs a second UTI I did recommend proceeding with more workup -either cystoscopy plus TRUS prostate sizing versus cystoscopy and CT urogram (considering intermittent microhematuria)

## 2024-02-12 LAB — PSA: Prostate Specific Ag, Serum: 4.4 ng/mL — ABNORMAL HIGH (ref 0.0–4.0)

## 2024-02-13 ENCOUNTER — Ambulatory Visit: Payer: Self-pay | Admitting: Urology

## 2024-02-24 ENCOUNTER — Telehealth: Payer: Self-pay | Admitting: Urology

## 2024-02-24 MED ORDER — FINASTERIDE 5 MG PO TABS: 5.0000 mg | ORAL_TABLET | Freq: Every day | ORAL | 1 refills | Status: AC

## 2024-02-24 NOTE — Telephone Encounter (Signed)
 Pt requested refill for finasteride  medication to [facility] in [place]. I have refilled that medication today patient should continue taking both finasteride  and tamsulosin  per Dr.Garren note. Called to let patient know medication was refilled. Patient voiced understanding and was appreciative of the call.- Alonnie Bieker CMA,

## 2024-02-24 NOTE — Telephone Encounter (Signed)
 Patient called to request refill for Finasteride . He said when he contacted [facility], he was told he needed to call our office to request refill be sent. Pharmacy is [facility] in [place].

## 2024-02-24 NOTE — Addendum Note (Signed)
 Addended by: Dreon Pineda E on: 02/24/2024 03:47 PM   Modules accepted: Orders

## 2024-08-13 ENCOUNTER — Ambulatory Visit: Admit: 2024-08-13

## 2025-02-10 ENCOUNTER — Ambulatory Visit: Admitting: Urology
